# Patient Record
Sex: Female | Born: 1965 | Race: White | Hispanic: No | Marital: Married | Smoking: Former smoker
Health system: Southern US, Community
[De-identification: ages and names within clinical notes are randomized; demographics above are authoritative.]

## PROBLEM LIST (undated history)

## (undated) DIAGNOSIS — R0683 Snoring: Secondary | ICD-10-CM

## (undated) DIAGNOSIS — G4730 Sleep apnea, unspecified: Secondary | ICD-10-CM

## (undated) DIAGNOSIS — K219 Gastro-esophageal reflux disease without esophagitis: Secondary | ICD-10-CM

## (undated) DIAGNOSIS — F32A Depression, unspecified: Secondary | ICD-10-CM

## (undated) DIAGNOSIS — G2581 Restless legs syndrome: Secondary | ICD-10-CM

## (undated) DIAGNOSIS — E785 Hyperlipidemia, unspecified: Secondary | ICD-10-CM

## (undated) DIAGNOSIS — F329 Major depressive disorder, single episode, unspecified: Secondary | ICD-10-CM

## (undated) DIAGNOSIS — F419 Anxiety disorder, unspecified: Secondary | ICD-10-CM

## (undated) HISTORY — PX: CARPAL TUNNEL RELEASE: SHX101

---

## 2013-07-07 HISTORY — PX: ABDOMINAL HYSTERECTOMY: SHX81

## 2013-11-25 ENCOUNTER — Other Ambulatory Visit (HOSPITAL_COMMUNITY): Payer: Self-pay | Admitting: Specialist

## 2013-11-25 DIAGNOSIS — M25562 Pain in left knee: Secondary | ICD-10-CM

## 2013-12-09 ENCOUNTER — Ambulatory Visit (HOSPITAL_COMMUNITY): Payer: 59

## 2014-01-31 ENCOUNTER — Other Ambulatory Visit: Payer: Self-pay | Admitting: Orthopedic Surgery

## 2014-02-11 ENCOUNTER — Encounter (HOSPITAL_BASED_OUTPATIENT_CLINIC_OR_DEPARTMENT_OTHER): Payer: Self-pay | Admitting: *Deleted

## 2014-02-11 NOTE — Progress Notes (Signed)
Pt just moved from [place]-may not have anyone with her post op-to call surgeon about staying overnight Denies any cardiac /resp problems Does snore-has been mentioned about a sleep study

## 2014-02-14 ENCOUNTER — Encounter (HOSPITAL_BASED_OUTPATIENT_CLINIC_OR_DEPARTMENT_OTHER): Payer: Self-pay | Admitting: *Deleted

## 2014-02-14 ENCOUNTER — Ambulatory Visit (HOSPITAL_BASED_OUTPATIENT_CLINIC_OR_DEPARTMENT_OTHER): Payer: 59 | Admitting: Anesthesiology

## 2014-02-14 ENCOUNTER — Ambulatory Visit (HOSPITAL_BASED_OUTPATIENT_CLINIC_OR_DEPARTMENT_OTHER)
Admission: RE | Admit: 2014-02-14 | Discharge: 2014-02-14 | Disposition: A | Discharge status: Home / Self Care | Payer: 59 | Source: Ambulatory Visit | Attending: Orthopedic Surgery | Admitting: Orthopedic Surgery

## 2014-02-14 ENCOUNTER — Encounter (HOSPITAL_BASED_OUTPATIENT_CLINIC_OR_DEPARTMENT_OTHER)
Admission: RE | Disposition: A | Discharge status: Home / Self Care | Payer: Self-pay | Source: Ambulatory Visit | Attending: Orthopedic Surgery

## 2014-02-14 DIAGNOSIS — Z87891 Personal history of nicotine dependence: Secondary | ICD-10-CM | POA: Diagnosis not present

## 2014-02-14 DIAGNOSIS — F329 Major depressive disorder, single episode, unspecified: Secondary | ICD-10-CM | POA: Insufficient documentation

## 2014-02-14 DIAGNOSIS — E785 Hyperlipidemia, unspecified: Secondary | ICD-10-CM | POA: Insufficient documentation

## 2014-02-14 DIAGNOSIS — G2581 Restless legs syndrome: Secondary | ICD-10-CM | POA: Diagnosis not present

## 2014-02-14 DIAGNOSIS — R0683 Snoring: Secondary | ICD-10-CM | POA: Insufficient documentation

## 2014-02-14 DIAGNOSIS — F419 Anxiety disorder, unspecified: Secondary | ICD-10-CM | POA: Diagnosis not present

## 2014-02-14 DIAGNOSIS — M7531 Calcific tendinitis of right shoulder: Secondary | ICD-10-CM | POA: Diagnosis present

## 2014-02-14 HISTORY — DX: Snoring: R06.83

## 2014-02-14 HISTORY — DX: Anxiety disorder, unspecified: F41.9

## 2014-02-14 HISTORY — PX: SHOULDER ARTHROSCOPY: SHX128

## 2014-02-14 HISTORY — DX: Hyperlipidemia, unspecified: E78.5

## 2014-02-14 HISTORY — DX: Major depressive disorder, single episode, unspecified: F32.9

## 2014-02-14 HISTORY — DX: Restless legs syndrome: G25.81

## 2014-02-14 HISTORY — DX: Depression, unspecified: F32.A

## 2014-02-14 LAB — POCT HEMOGLOBIN-HEMACUE: Hemoglobin: 12.9 g/dL (ref 12.0–15.0)

## 2014-02-14 SURGERY — ARTHROSCOPY, SHOULDER
Anesthesia: Regional | Site: Shoulder | Laterality: Right

## 2014-02-14 MED ORDER — SODIUM CHLORIDE 0.9 % IR SOLN
Status: DC | PRN
  Administered 2014-02-14: 3000 mL

## 2014-02-14 MED ORDER — PROMETHAZINE HCL 25 MG/ML IJ SOLN
6.2500 mg | Freq: Once | INTRAMUSCULAR | Status: AC
  Administered 2014-02-14: 6.25 mg via INTRAVENOUS

## 2014-02-14 MED ORDER — FENTANYL CITRATE 0.05 MG/ML IJ SOLN
INTRAMUSCULAR | Status: DC | PRN
  Administered 2014-02-14: 100 ug via INTRAVENOUS

## 2014-02-14 MED ORDER — OXYCODONE HCL 5 MG/5ML PO SOLN: 5.0000 mg | Freq: Once | ORAL | Status: DC | PRN

## 2014-02-14 MED ORDER — SUCCINYLCHOLINE CHLORIDE 20 MG/ML IJ SOLN
INTRAMUSCULAR | Status: DC | PRN
  Administered 2014-02-14: 100 mg via INTRAVENOUS

## 2014-02-14 MED ORDER — DOCUSATE SODIUM 100 MG PO CAPS: 100.0000 mg | ORAL_CAPSULE | Freq: Three times a day (TID) | ORAL | Status: DC | PRN

## 2014-02-14 MED ORDER — FENTANYL CITRATE 0.05 MG/ML IJ SOLN
INTRAMUSCULAR | Status: AC
  Filled 2014-02-14: qty 2

## 2014-02-14 MED ORDER — PROMETHAZINE HCL 25 MG/ML IJ SOLN
INTRAMUSCULAR | Status: AC
  Filled 2014-02-14: qty 1

## 2014-02-14 MED ORDER — FENTANYL CITRATE 0.05 MG/ML IJ SOLN
50.0000 ug | INTRAMUSCULAR | Status: DC | PRN
  Administered 2014-02-14: 100 ug via INTRAVENOUS

## 2014-02-14 MED ORDER — POVIDONE-IODINE 7.5 % EX SOLN: Freq: Once | CUTANEOUS | Status: DC

## 2014-02-14 MED ORDER — CEFAZOLIN SODIUM-DEXTROSE 2-3 GM-% IV SOLR
INTRAVENOUS | Status: AC
  Filled 2014-02-14: qty 50

## 2014-02-14 MED ORDER — OXYCODONE-ACETAMINOPHEN 5-325 MG PO TABS: 1.0000 | ORAL_TABLET | ORAL | Status: DC | PRN

## 2014-02-14 MED ORDER — CEFAZOLIN SODIUM-DEXTROSE 2-3 GM-% IV SOLR
2.0000 g | INTRAVENOUS | Status: AC
  Administered 2014-02-14: 2 g via INTRAVENOUS

## 2014-02-14 MED ORDER — OXYCODONE HCL 5 MG PO TABS: 5.0000 mg | ORAL_TABLET | Freq: Once | ORAL | Status: DC | PRN

## 2014-02-14 MED ORDER — HYDROMORPHONE HCL 1 MG/ML IJ SOLN: 0.2500 mg | INTRAMUSCULAR | Status: DC | PRN

## 2014-02-14 MED ORDER — LACTATED RINGERS IV SOLN
INTRAVENOUS | Status: DC
  Administered 2014-02-14 (×2): via INTRAVENOUS

## 2014-02-14 MED ORDER — PROPOFOL 10 MG/ML IV BOLUS
INTRAVENOUS | Status: DC | PRN
  Administered 2014-02-14: 200 mg via INTRAVENOUS
  Administered 2014-02-14: 50 mg via INTRAVENOUS

## 2014-02-14 MED ORDER — MIDAZOLAM HCL 2 MG/2ML IJ SOLN
1.0000 mg | INTRAMUSCULAR | Status: DC | PRN
  Administered 2014-02-14: 2 mg via INTRAVENOUS

## 2014-02-14 MED ORDER — MIDAZOLAM HCL 2 MG/2ML IJ SOLN
INTRAMUSCULAR | Status: AC
  Filled 2014-02-14: qty 2

## 2014-02-14 MED ORDER — DEXAMETHASONE SODIUM PHOSPHATE 4 MG/ML IJ SOLN
INTRAMUSCULAR | Status: DC | PRN
  Administered 2014-02-14: 10 mg via INTRAVENOUS

## 2014-02-14 MED ORDER — BUPIVACAINE HCL (PF) 0.5 % IJ SOLN
INTRAMUSCULAR | Status: AC
  Filled 2014-02-14: qty 30

## 2014-02-14 MED ORDER — EPHEDRINE SULFATE 50 MG/ML IJ SOLN
INTRAMUSCULAR | Status: DC | PRN
  Administered 2014-02-14: 10 mg via INTRAVENOUS

## 2014-02-14 MED ORDER — ROPIVACAINE HCL 5 MG/ML IJ SOLN
INTRAMUSCULAR | Status: DC | PRN
  Administered 2014-02-14: 25 mL via PERINEURAL

## 2014-02-14 MED ORDER — ONDANSETRON HCL 4 MG/2ML IJ SOLN
INTRAMUSCULAR | Status: DC | PRN
  Administered 2014-02-14: 4 mg via INTRAVENOUS

## 2014-02-14 SURGICAL SUPPLY — 76 items
BENZOIN TINCTURE PRP APPL 2/3 (GAUZE/BANDAGES/DRESSINGS) IMPLANT
BLADE CLIPPER SURG (BLADE) IMPLANT
BLADE SURG 15 STRL LF DISP TIS (BLADE) IMPLANT
BLADE SURG 15 STRL SS (BLADE)
BUR OVAL 4.0 (BURR) ×3 IMPLANT
CANISTER SUCT 3000ML (MISCELLANEOUS) IMPLANT
CANNULA 5.75X71 LONG (CANNULA) ×3 IMPLANT
CANNULA TWIST IN 8.25X7CM (CANNULA) IMPLANT
CHLORAPREP W/TINT 26ML (MISCELLANEOUS) ×3 IMPLANT
DECANTER SPIKE VIAL GLASS SM (MISCELLANEOUS) IMPLANT
DRAPE INCISE IOBAN 66X45 STRL (DRAPES) ×3 IMPLANT
DRAPE STERI 35X30 U-POUCH (DRAPES) ×3 IMPLANT
DRAPE SURG 17X23 STRL (DRAPES) ×3 IMPLANT
DRAPE U 20/CS (DRAPES) ×3 IMPLANT
DRAPE U-SHAPE 47X51 STRL (DRAPES) ×3 IMPLANT
DRAPE U-SHAPE 76X120 STRL (DRAPES) ×6 IMPLANT
DRSG PAD ABDOMINAL 8X10 ST (GAUZE/BANDAGES/DRESSINGS) ×3 IMPLANT
ELECT REM PT RETURN 9FT ADLT (ELECTROSURGICAL) ×3
ELECTRODE REM PT RTRN 9FT ADLT (ELECTROSURGICAL) ×2 IMPLANT
GAUZE SPONGE 4X4 12PLY STRL (GAUZE/BANDAGES/DRESSINGS) ×3 IMPLANT
GAUZE SPONGE 4X4 16PLY XRAY LF (GAUZE/BANDAGES/DRESSINGS) IMPLANT
GAUZE XEROFORM 1X8 LF (GAUZE/BANDAGES/DRESSINGS) ×3 IMPLANT
GLOVE BIO SURGEON STRL SZ7 (GLOVE) ×3 IMPLANT
GLOVE BIO SURGEON STRL SZ7.5 (GLOVE) ×3 IMPLANT
GLOVE BIOGEL M 7.0 STRL (GLOVE) ×3 IMPLANT
GLOVE BIOGEL PI IND STRL 7.0 (GLOVE) ×2 IMPLANT
GLOVE BIOGEL PI IND STRL 8 (GLOVE) ×2 IMPLANT
GLOVE BIOGEL PI INDICATOR 7.0 (GLOVE) ×1
GLOVE BIOGEL PI INDICATOR 8 (GLOVE) ×1
GOWN STRL REUS W/ TWL LRG LVL3 (GOWN DISPOSABLE) ×2 IMPLANT
GOWN STRL REUS W/TWL LRG LVL3 (GOWN DISPOSABLE) ×1
GOWN STRL REUS W/TWL XL LVL3 (GOWN DISPOSABLE) ×6 IMPLANT
GOWN STRL REUS W/TWL XL LVL4 (GOWN DISPOSABLE) ×3 IMPLANT
LIQUID BAND (GAUZE/BANDAGES/DRESSINGS) IMPLANT
MANIFOLD NEPTUNE II (INSTRUMENTS) ×3 IMPLANT
NDL SUT 6 .5 CRC .975X.05 MAYO (NEEDLE) IMPLANT
NEEDLE 1/2 CIR CATGUT .05X1.09 (NEEDLE) IMPLANT
NEEDLE MAYO TAPER (NEEDLE)
NEEDLE SCORPION MULTI FIRE (NEEDLE) IMPLANT
NS IRRIG 1000ML POUR BTL (IV SOLUTION) IMPLANT
PACK ARTHROSCOPY DSU (CUSTOM PROCEDURE TRAY) ×3 IMPLANT
PACK BASIN DAY SURGERY FS (CUSTOM PROCEDURE TRAY) ×3 IMPLANT
PENCIL BUTTON HOLSTER BLD 10FT (ELECTRODE) IMPLANT
RESECTOR FULL RADIUS 4.2MM (BLADE) ×3 IMPLANT
SLEEVE SCD COMPRESS KNEE MED (MISCELLANEOUS) ×3 IMPLANT
SLING ARM IMMOBILIZER MED (SOFTGOODS) IMPLANT
SLING ARM LRG ADULT FOAM STRAP (SOFTGOODS) ×3 IMPLANT
SLING ARM MED ADULT FOAM STRAP (SOFTGOODS) IMPLANT
SLING ARM XL FOAM STRAP (SOFTGOODS) IMPLANT
SPONGE LAP 4X18 X RAY DECT (DISPOSABLE) IMPLANT
STRIP CLOSURE SKIN 1/2X4 (GAUZE/BANDAGES/DRESSINGS) IMPLANT
SUCTION FRAZIER TIP 10 FR DISP (SUCTIONS) IMPLANT
SUPPORT WRAP ARM LG (MISCELLANEOUS) IMPLANT
SUT 2 FIBERLOOP 20 STRT BLUE (SUTURE)
SUT BONE WAX W31G (SUTURE) IMPLANT
SUT ETHILON 3 0 PS 1 (SUTURE) ×3 IMPLANT
SUT FIBERWIRE #2 38 T-5 BLUE (SUTURE)
SUT MNCRL AB 3-0 PS2 18 (SUTURE) IMPLANT
SUT MNCRL AB 4-0 PS2 18 (SUTURE) IMPLANT
SUT PDS AB 0 CT 36 (SUTURE) IMPLANT
SUT PROLENE 3 0 PS 2 (SUTURE) IMPLANT
SUT TIGER TAPE 7 IN WHITE (SUTURE) IMPLANT
SUT VIC AB 0 CT1 27 (SUTURE)
SUT VIC AB 0 CT1 27XBRD ANBCTR (SUTURE) IMPLANT
SUT VIC AB 2-0 SH 27 (SUTURE)
SUT VIC AB 2-0 SH 27XBRD (SUTURE) IMPLANT
SUTURE 2 FIBERLOOP 20 STRT BLU (SUTURE) IMPLANT
SUTURE FIBERWR #2 38 T-5 BLUE (SUTURE) IMPLANT
SYR BULB 3OZ (MISCELLANEOUS) IMPLANT
TOWEL OR 17X24 6PK STRL BLUE (TOWEL DISPOSABLE) ×3 IMPLANT
TOWEL OR NON WOVEN STRL DISP B (DISPOSABLE) ×3 IMPLANT
TUBE CONNECTING 20X1/4 (TUBING) ×6 IMPLANT
TUBING ARTHROSCOPY IRRIG 16FT (MISCELLANEOUS) ×3 IMPLANT
WAND STAR VAC 90 (SURGICAL WAND) ×3 IMPLANT
WATER STERILE IRR 1000ML POUR (IV SOLUTION) ×3 IMPLANT
YANKAUER SUCT BULB TIP NO VENT (SUCTIONS) IMPLANT

## 2014-02-14 NOTE — H&P (Signed)
Sherri Perry is an 48 y.o. female.   Chief Complaint: R shoulder pain HPI: R shoulder calcific tendonitis, failed conservative treatment.  Past Medical History  Diagnosis Date  . Depression   . Anxiety   . Hyperlipemia   . RLS (restless legs syndrome)   . Snores     Past Surgical History  Procedure Laterality Date  . Abdominal hysterectomy  4/15  . Carpal tunnel release      right and left    History reviewed. No pertinent family history. Social History:  reports that she quit smoking about 20 years ago. She does not have any smokeless tobacco history on file. She reports that she drinks alcohol. She reports that she does not use illicit drugs.  Allergies: No Known Allergies  Medications Prior to Admission  Medication Sig Dispense Refill  . atorvastatin (LIPITOR) 20 MG tablet Take 20 mg by mouth daily.    . Iron-Vit C-Vit B12-Folic Acid (IRON 007 PLUS PO) Take by mouth.    . Multiple Vitamins-Minerals (MULTIVITAMIN WITH MINERALS) tablet Take 1 tablet by mouth daily.    . pramipexole (MIRAPEX) 0.125 MG tablet Take 0.125 mg by mouth 3 (three) times daily.    Marland Kitchen rOPINIRole (REQUIP) 2 MG tablet Take 2 mg by mouth at bedtime.    . sertraline (ZOLOFT) 100 MG tablet Take 200 mg by mouth daily.      Results for orders placed or performed during the hospital encounter of 02/14/14 (from the past 48 hour(s))  Hemoglobin-hemacue, POC     Status: None   Collection Time: 02/14/14 12:00 PM  Result Value Ref Range   Hemoglobin 12.9 12.0 - 15.0 g/dL   No results found.  Review of Systems  All other systems reviewed and are negative.   Blood pressure 117/65, pulse 74, temperature 98.5 F (36.9 C), temperature source Oral, resp. rate 20, height 5\' 2"  (1.575 m), weight 87.091 kg (192 lb), SpO2 95 %. Physical Exam  Constitutional: She is oriented to person, place, and time. She appears well-developed and well-nourished.  HENT:  Head: Atraumatic.  Eyes: EOM are normal.   Cardiovascular: Intact distal pulses.   Respiratory: Effort normal.  Musculoskeletal:  R shoulder pain with RC / impingement testing, NVI  Neurological: She is alert and oriented to person, place, and time.  Skin: Skin is warm and dry.  Psychiatric: She has a normal mood and affect.     Assessment/Plan R shoulder calcific tendonitis Plan R arth debridement, possible RCR, SAD Risks / benefits of surgery discussed Consent on chart  NPO for OR Preop antibiotics   Deijah Spikes WILLIAM 02/14/2014, 12:48 PM

## 2014-02-14 NOTE — Op Note (Signed)
Procedure(s):  SANDRALEE TARKINGTON female 48 y.o. 02/14/2014  Procedure(s) and Anesthesia Type:   Right shoulder arthroscopic extensive debridement calcific tendinitis  Surgeon(s) and Role:    * Nita Sells, MD - Primary     Surgeon: Nita Sells   Assistants: Jeanmarie Hubert PA-C [facility] was present and scrubbed throughout the procedure and was essential in positioning, assisting with the camera and instrumentation,, and closure)  Anesthesia: General endotracheal anesthesia with preoperative interscalene block    Procedure Detail   Estimated Blood Loss: Min         Drains: none  Blood Given: none         Specimens: none        Complications:  * No complications entered in OR log *         Disposition: PACU - hemodynamically stable.         Condition: stable    Procedure:   INDICATIONS FOR SURGERY: The patient is 48 y.o. female who has had a long history of right shoulder pain with calcific tendinitis which hasfailed conservative treatment.  OPERATIVE FINDINGS: Examination under anesthesia: no stiffness or instability Diagnostic Arthroscopy:  Glenoid articular cartilage:intact Humeral head articular cartilage: intact Labrum: intact Loose bodies: none Synovitis: none Articular sided rotator cuff: intact Bursal sided rotator cuff: intact, large calcific deposit, completely excised through a small longitudinal split in the supraspinatus. Coracoacromial ligament: intact, no fraying  DESCRIPTION OF PROCEDURE: The patient was identified in preoperative  holding area where I personally marked the operative site after  verifying site, side, and procedure with the patient. An interscalene block was given by the attending anesthesiologist the holding area.  The patient was taken back to the operating room where general anesthesia was induced without complication and was placed in the beach-chair position with the back  elevated about 60  degrees and all extremities and head and neck carefully padded and  positioned.   The right upper extremity was then prepped and  draped in a standard sterile fashion. The appropriate time-out  procedure was carried out. The patient did receive IV antibiotics  within 30 minutes of incision.   A small posterior portal incision was made and the arthroscope was introduced into the joint. An anterior portal was then established above the subscapularis using needle localization. Small cannula was placed anteriorly. Diagnostic arthroscopy was then carried out with findings as described above.  Glenohumeral joint looked very good. Underside of the rotator cuff was completely intact.  The arthroscope was then introduced into the subacromial space a standard lateral portal was established with needle localization. The shaver was used through the lateral portal to perform extensive bursectomy. Coracoacromial ligament was examined and found to be intact without significant fraying.Marland Kitchen  She was noted to have a significant amount of bursitis. After the extensive bursectomy the bursal surface the rotator cuff was carefully examined. There is no partial thickness tearing. There was a slight prominent centrally where I could tell there was some calcium beneath the ear. I used a spinal needle to first probed and once I found the area of calcium I used an 11 blade to split the tendon longitudinally about 5 mm. A probe was then used to release the calcium. A large amount calcium was removed first with appropriate and subsequently with the shaver was suctioned. There is no exposed tuberosity. A longitudinal split in the tendon stayed quite small and was not felt necessary for any formal closure.  The arthroscopic equipment was removed from  the joint and the portals were closed with 3-0 nylon in an interrupted fashion. Sterile dressings were then applied including Xeroform 4 x 4's ABDs and tape. The patient was then  allowed to awaken from general anesthesia, placed in a sling, transferred to the stretcher and taken to the recovery room in stable condition.   POSTOPERATIVE PLAN: The patient will be discharged home today and will followup in one week for suture removal and wound check.

## 2014-02-14 NOTE — Discharge Instructions (Signed)
Discharge Instructions after Arthroscopic Shoulder Surgery ° ° °A sling has been provided for you. You may remove the sling after 72 hours. The sling may be worn for your protection, if you are in a crowd.  °Use ice on the shoulder intermittently over the first 48 hours after surgery.  °Pain medication has been prescribed for you.  °Use your medication liberally over the first 48 hours, and then begin to taper your use. You may take Extra Strength Tylenol or Tylenol only in place of the pain pills. DO NOT take ANY nonsteroidal anti-inflammatory pain medications: Advil, Motrin, Ibuprofen, Aleve, Naproxen, or Naprosyn.  °You may remove your dressing after two days.  °You may shower 5 days after surgery. The incision CANNOT get wet prior to 5 days. Simply allow the water to wash over the site and then pat dry. Do not rub the incision. Make sure your axilla (armpit) is completely dry after showering.  °Take one aspirin a day for 2 weeks after surgery, unless you have an aspirin sensitivity/allergy or asthma.  °Three to 5 times each day you should perform assisted overhead reaching and external rotation (outward turning) exercises with the operative arm. Both exercises should be done with the non-operative arm used as the "therapist arm" while the operative arm remains relaxed. Ten of each exercise should be done three to five times each day. ° ° ° °Overhead reach is helping to lift your stiff arm up as high as it will go. To stretch your overhead reach, lie flat on your back, relax, and grasp the wrist of the tight shoulder with your opposite hand. Using the power in your opposite arm, bring the stiff arm up as far as it is comfortable. Start holding it for ten seconds and then work up to where you can hold it for a count of 30. Breathe slowly and deeply while the arm is moved. Repeat this stretch ten times, trying to help the arm up a little higher each time.  ° ° ° ° ° °External rotation is turning the arm out to  the side while your elbow stays close to your body. External rotation is best stretched while you are lying on your back. Hold a cane, yardstick, broom handle, or dowel in both hands. Bend both elbows to a right angle. Use steady, gentle force from your normal arm to rotate the hand of the stiff shoulder out away from your body. Continue the rotation as far as it will go comfortably, holding it there for a count of 10. Repeat this exercise ten times.  ° ° ° °Please call [phone] during normal business hours or [phone] after hours for any problems. Including the following: ° °- excessive redness of the incisions °- drainage for more than 4 days °- fever of more than 101.5 F ° °*Please note that pain medications will not be refilled after hours or on weekends. ° ° ° °Post Anesthesia Home Care Instructions ° °Activity: °Get plenty of rest for the remainder of the day. A responsible adult should stay with you for 24 hours following the procedure.  °For the next 24 hours, DO NOT: °-Drive a car °-Operate machinery °-Drink alcoholic beverages °-Take any medication unless instructed by your physician °-Make any legal decisions or sign important papers. ° °Meals: °Start with liquid foods such as gelatin or soup. Progress to regular foods as tolerated. Avoid greasy, spicy, heavy foods. If nausea and/or vomiting occur, drink only clear liquids until the nausea and/or vomiting subsides. Call   your physician if vomiting continues. ° °Special Instructions/Symptoms: °Your throat may feel dry or sore from the anesthesia or the breathing tube placed in your throat during surgery. If this causes discomfort, gargle with warm salt water. The discomfort should disappear within 24 hours. ° ° °Regional Anesthesia Blocks ° °1. Numbness or the inability to move the "blocked" extremity may last from 3-48 hours after placement. The length of time depends on the medication injected and your individual response to the medication. If the  numbness is not going away after 48 hours, call your surgeon. ° °2. The extremity that is blocked will need to be protected until the numbness is gone and the  Strength has returned. Because you cannot feel it, you will need to take extra care to avoid injury. Because it may be weak, you may have difficulty moving it or using it. You may not know what position it is in without looking at it while the block is in effect. ° °3. For blocks in the legs and feet, returning to weight bearing and walking needs to be done carefully. You will need to wait until the numbness is entirely gone and the strength has returned. You should be able to move your leg and foot normally before you try and bear weight or walk. You will need someone to be with you when you first try to ensure you do not fall and possibly risk injury. ° °4. Bruising and tenderness at the needle site are common side effects and will resolve in a few days. ° °5. Persistent numbness or new problems with movement should be communicated to the surgeon or the [facility] ([phone])/ [facility] ([phone]). °

## 2014-02-14 NOTE — Transfer of Care (Signed)
Immediate Anesthesia Transfer of Care Note  Patient: Sherri Perry  Procedure(s) Performed: Procedure(s) with comments: ARTHROSCOPY SHOULDER (Right) - Right shoulder arthroscopy debridement calcific tendonitis, subacromial decompression,   Patient Location: PACU  Anesthesia Type:GA combined with regional for post-op pain  Level of Consciousness: awake, sedated and patient cooperative  Airway & Oxygen Therapy: Patient Spontanous Breathing and Patient connected to face mask oxygen  Post-op Assessment: Report given to PACU RN and Post -op Vital signs reviewed and stable  Post vital signs: Reviewed and stable  Complications: No apparent anesthesia complications

## 2014-02-14 NOTE — Anesthesia Procedure Notes (Addendum)
Anesthesia Regional Block:  Interscalene brachial plexus block  Pre-Anesthetic Checklist: ,, timeout performed, Correct Patient, Correct Site, Correct Laterality, Correct Procedure, Correct Position, site marked, Risks and benefits discussed, pre-op evaluation,  At surgeon's request and post-op pain management  Laterality: Right  Prep: Maximum Sterile Barrier Precautions used and chloraprep       Needles:  Injection technique: Single-shot  Needle Type: Echogenic Stimulator Needle     Needle Length: 5cm 5 cm Needle Gauge: 22 and 22 G    Additional Needles:  Procedures: ultrasound guided (picture in chart) and nerve stimulator Interscalene brachial plexus block  Nerve Stimulator or Paresthesia:  Response: Biceps response,   Additional Responses:   Narrative:  Start time: 02/14/2014 12:43 PM End time: 02/14/2014 12:52 PM Injection made incrementally with aspirations every 5 mL.  Additional Notes: 2% Lidocaine skin wheel.    Procedure Name: Intubation Date/Time: 02/14/2014 1:17 PM Performed by: Lyndee Leo Pre-anesthesia Checklist: Patient identified, Emergency Drugs available, Suction available and Patient being monitored Patient Re-evaluated:Patient Re-evaluated prior to inductionOxygen Delivery Method: Circle System Utilized Preoxygenation: Pre-oxygenation with 100% oxygen Intubation Type: IV induction Ventilation: Mask ventilation without difficulty Laryngoscope Size: Miller and 2 Grade View: Grade IV Tube type: Oral Tube size: 7.0 mm Number of attempts: 3 Airway Equipment and Method: stylet,  oral airway,  Patient positioned with wedge pillow and Video-laryngoscopy Placement Confirmation: ETT inserted through vocal cords under direct vision,  positive ETCO2 and breath sounds checked- equal and bilateral Secured at: 21 cm Tube secured with: Tape Dental Injury: Teeth and Oropharynx as per pre-operative assessment  Difficulty Due To: Difficulty was anticipated,  Difficult Airway- due to large tongue and Difficult Airway- due to limited oral opening Future Recommendations: Recommend- induction with short-acting agent, and alternative techniques readily available Comments: Unable to visualize vocal cords with Sabra Heck #2 x2. Epiglotis only. Glidescope x1. Ebbs checked by Dr. Ola Spurr.

## 2014-02-14 NOTE — Anesthesia Preprocedure Evaluation (Signed)
Anesthesia Evaluation  Patient identified by MRN, date of birth, ID band Patient awake    Reviewed: Allergy & Precautions, H&P , NPO status , Patient's Chart, lab work & pertinent test results  Airway Mallampati: II  TM Distance: >3 FB Neck ROM: Full    Dental no notable dental hx. (+) Teeth Intact, Dental Advisory Given   Pulmonary neg pulmonary ROS, former smoker,  breath sounds clear to auscultation  Pulmonary exam normal       Cardiovascular negative cardio ROS  Rhythm:Regular Rate:Normal     Neuro/Psych Anxiety Depression negative neurological ROS     GI/Hepatic negative GI ROS, Neg liver ROS,   Endo/Other  negative endocrine ROS  Renal/GU negative Renal ROS  negative genitourinary   Musculoskeletal   Abdominal   Peds  Hematology negative hematology ROS (+)   Anesthesia Other Findings   Reproductive/Obstetrics negative OB ROS                             Anesthesia Physical Anesthesia Plan  ASA: II  Anesthesia Plan: General and Regional   Post-op Pain Management:    Induction: Intravenous  Airway Management Planned: Oral ETT  Additional Equipment:   Intra-op Plan:   Post-operative Plan: Extubation in OR  Informed Consent: I have reviewed the patients History and Physical, chart, labs and discussed the procedure including the risks, benefits and alternatives for the proposed anesthesia with the patient or authorized representative who has indicated his/her understanding and acceptance.   Dental advisory given  Plan Discussed with: CRNA  Anesthesia Plan Comments:         Anesthesia Quick Evaluation

## 2014-02-14 NOTE — Progress Notes (Signed)
Assisted Dr. Edmond Fitzgerald with right, ultrasound guided, interscalene  block. Side rails up, monitors on throughout procedure. See vital signs in flow sheet. Tolerated Procedure well. 

## 2014-02-14 NOTE — Anesthesia Postprocedure Evaluation (Signed)
  Anesthesia Post-op Note  Patient: Sherri Perry  Procedure(s) Performed: Procedure(s) with comments: ARTHROSCOPY SHOULDER (Right) - Right shoulder arthroscopy debridement calcific tendonitis, subacromial decompression,   Patient Location: PACU  Anesthesia Type:General and block  Level of Consciousness: awake and alert   Airway and Oxygen Therapy: Patient Spontanous Breathing  Post-op Pain: none  Post-op Assessment: Post-op Vital signs reviewed, Patient's Cardiovascular Status Stable and Respiratory Function Stable  Post-op Vital Signs: Reviewed  Filed Vitals:   02/14/14 1445  BP:   Pulse: 95  Temp:   Resp: 19    Complications: No apparent anesthesia complications

## 2014-02-15 ENCOUNTER — Encounter (HOSPITAL_BASED_OUTPATIENT_CLINIC_OR_DEPARTMENT_OTHER): Payer: Self-pay | Admitting: Orthopedic Surgery

## 2014-02-25 ENCOUNTER — Ambulatory Visit (HOSPITAL_COMMUNITY)
Admission: RE | Admit: 2014-02-25 | Discharge: 2014-02-25 | Disposition: A | Discharge status: Home / Self Care | Payer: 59 | Source: Ambulatory Visit | Attending: Specialist | Admitting: Specialist

## 2014-02-25 DIAGNOSIS — M25562 Pain in left knee: Secondary | ICD-10-CM

## 2014-03-11 ENCOUNTER — Ambulatory Visit: Payer: 59 | Attending: Orthopedic Surgery | Admitting: Physical Therapy

## 2014-03-11 ENCOUNTER — Encounter: Payer: Self-pay | Admitting: Physical Therapy

## 2014-03-11 DIAGNOSIS — Z9889 Other specified postprocedural states: Secondary | ICD-10-CM | POA: Diagnosis present

## 2014-03-11 NOTE — Therapy (Signed)
[facility] [address] [place], [place], [address] Phone: [phone]   Fax:  [phone]  Physical Therapy Evaluation  Patient Details  Name: Sherri Perry MRN: [id number] Date of Birth: 01/02/1966  Encounter Date: 03/11/2014      PT End of Session - 03/11/14 1000    Visit Number 1   Number of Visits 12   Date for PT Re-Evaluation 04/22/14   PT Start Time 0813   PT Stop Time 0905   PT Time Calculation (min) 52 min   Activity Tolerance Patient tolerated treatment well      Past Medical History  Diagnosis Date  . Depression   . Anxiety   . Hyperlipemia   . RLS (restless legs syndrome)   . Snores     Past Surgical History  Procedure Laterality Date  . Abdominal hysterectomy  4/15  . Carpal tunnel release      right and left  . Shoulder arthroscopy Right 02/14/2014    Procedure: ARTHROSCOPY SHOULDER;  Surgeon: Nita Sells, MD;  Location: [place];  Service: Orthopedics;  Laterality: Right;  Right shoulder arthroscopy debridement calcific tendonitis, subacromial decompression,     There were no vitals taken for this visit.  Visit Diagnosis:  History of arthroscopy of right shoulder - Plan: PT plan of care cert/re-cert      Subjective Assessment - 03/11/14 0941    Symptoms Patient reports several years of right shoulder pain (with no known injury) and underwent right shoulder debridement of calcific tendonitis, extensive bursectomy, SAD with no formal closure of longitudinal split of supraspinatus on 02/14/14.  She  states she only wore the sling for a few days and has returned to her work in [place] after only 2 days.  She states she is still in a lot of pain but is not taking any pain medication.     Pertinent History Right hand dominant;  currently having left knee problems and recently had MRI   How long can you sit comfortably? no impairment   How long can you stand comfortably? no impairment    How long can you walk comfortably? no impairment   Patient Stated Goals return to yoga   Currently in Pain? Yes   Pain Score 5    Pain Location Shoulder   Pain Orientation Right   Pain Descriptors / Indicators Sore   Pain Type Surgical pain   Pain Onset More than a month ago   Pain Frequency Intermittent   Aggravating Factors  extending, abduction, flexion   Pain Relieving Factors rest with propped slightly forward   Multiple Pain Sites No          [facility] PT Assessment - 03/11/14 0949    Assessment   Medical Diagnosis s/p right shoulder arthroscopy   Onset Date --  > 2years;  surgery 02/14/14   Next MD Visit December ?   Prior Therapy No   Precautions   Precautions None   Restrictions   Weight Bearing Restrictions No   Balance Screen   Has the patient fallen in the past 6 months No   Has the patient had a decrease in activity level because of a fear of falling?  No   Is the patient reluctant to leave their home because of a fear of falling?  No   Home Environment   Living Enviornment Private residence   Prior Function   Level of [place] Independent with basic ADLs   Leisure --  yoga  Observation/Other Assessments   Focus on Therapeutic Outcomes (FOTO)  --  not captured   Posture/Postural Control   Posture/Postural Control No significant limitations  Swelling noted lateral upper right arm; portals appear heale   AROM   Right Shoulder Extension 0 Degrees   Right Shoulder Flexion 153 Degrees   Right Shoulder ABduction 118 Degrees   Right Shoulder Internal Rotation --  behind back to T10   Right Shoulder External Rotation 74 Degrees   Left Shoulder Extension 30 Degrees   Left Shoulder Flexion 157 Degrees   Left Shoulder ABduction 154 Degrees   Left Shoulder Internal Rotation --  behind back to T10   Left Shoulder External Rotation 74 Degrees   Strength   Right Shoulder Flexion 3-/5   Right Shoulder Extension 3-/5   Right Shoulder Internal Rotation 3-/5    Left Shoulder Extension 5/5   Left Shoulder ABduction 5/5   Left Shoulder External Rotation 5/5            PT Education - 03/11/14 0959    Education provided Yes   Education Details shoulder isometrics   Person(s) Educated Patient   Methods Explanation;Demonstration;Handout   Comprehension Verbalized understanding;Returned demonstration          PT Short Term Goals - 03/11/14 1008    PT SHORT TERM GOAL #1   Title Right shoulder flexion improved to 156 degrees needed to reach kitchen shelves.     Time 3   Period Weeks   Status New   PT SHORT TERM GOAL #2   Title Demonstrate and verbalize understanding of condition management including RICE, positioning, HEP.    Time 3   Period Weeks   Status New   PT SHORT TERM GOAL #3   Title "Report pain decrease from    5/10 to    3/10 with usual daily home and work ADLs.   Time 3   Period Weeks   Status New          PT Long Term Goals - 03/11/14 1012    PT LONG TERM GOAL #1   Title "Pt will be independent with advanced HEP for further gains in strength needed for higher level of physical activity.   Time 6   Period Weeks   Status New   PT LONG TERM GOAL #2   Title "R shoulder AROM scaption will improve to 0-158 degrees for improved overhead reaching   Time 6   Period Weeks   Status New   PT LONG TERM GOAL #3   Title "Pain will decrease to 1-2/10 with usual daily functional activities at work and home.   Time 6   Period Weeks   Status New          Plan - 03/11/14 1001    Clinical Impression Statement s/p right shoulder scope on 02/14/14 with extensive debridement for calcific tendonitis and SAD.  She reports a moderate amount of pain but is no longer taking pain medicine.  Her right shoulder AROM is limited: extension 0 and painful flexion 153, abd 118 and painful, ER 74, IR behind back to T10.  Strength grossly 3-/5.  Mild swelling right upper arm.  No redness.  Portals appear well-healed.  She is back to work in  [place] and hopes to return to yoga.      Pt will benefit from skilled therapeutic intervention in order to improve on the following deficits Decreased range of motion;Impaired UE functional use;Pain;Decreased strength   Rehab Potential Good  PT Frequency 2x / week   PT Duration 6 weeks   PT Treatment/Interventions ADLs/Self Care Home Management;Cryotherapy;Moist Heat;Therapeutic exercise;Neuromuscular re-education;Patient/family education;Manual techniques   PT Next Visit Plan Do Quick DASH since FOTO not captured; Right shoulder progresive ROM toward end-range;  assess response to shoulder isometric HEP and progress to yellow band [place] as tolerated; shoulder and scapular joint mobs, cold pack   Consulted and Agree with Plan of Care Patient                              Problem List There are no active problems to display for this patient.   Ruben Im C 03/11/2014, 10:24 AM     Ruben Im, PT 03/11/2014 10:25 AM Phone: [phone] Fax: [phone]

## 2014-03-11 NOTE — Patient Instructions (Signed)
Instructed patient in shoulder isometrics and scapular retractions

## 2014-03-15 ENCOUNTER — Ambulatory Visit: Payer: 59 | Admitting: Physical Therapy

## 2014-03-17 ENCOUNTER — Telehealth: Payer: Self-pay | Admitting: Physical Therapy

## 2014-03-17 ENCOUNTER — Ambulatory Visit: Payer: 59 | Admitting: Physical Therapy

## 2014-03-17 NOTE — Telephone Encounter (Signed)
Pt was called on 03-15-14 and was left a message about missing appt and told she had an appt at 8:00 on 03-17-14.  When she did not come to 12-10 appt. Pt was called and phone number message said the phone mail box was no longer in use.

## 2014-03-22 ENCOUNTER — Ambulatory Visit: Payer: 59 | Admitting: Physical Therapy

## 2014-03-24 ENCOUNTER — Encounter: Payer: Self-pay | Admitting: Physical Therapy

## 2014-04-08 LAB — HM COLONOSCOPY

## 2014-05-11 ENCOUNTER — Encounter (HOSPITAL_BASED_OUTPATIENT_CLINIC_OR_DEPARTMENT_OTHER): Payer: Self-pay

## 2014-12-07 ENCOUNTER — Other Ambulatory Visit: Payer: Self-pay

## 2014-12-07 DIAGNOSIS — Z1231 Encounter for screening mammogram for malignant neoplasm of breast: Secondary | ICD-10-CM

## 2015-01-11 ENCOUNTER — Ambulatory Visit: Payer: Self-pay

## 2015-03-23 ENCOUNTER — Ambulatory Visit
Admission: RE | Admit: 2015-03-23 | Discharge: 2015-03-23 | Disposition: A | Discharge status: Home / Self Care | Payer: 59 | Source: Ambulatory Visit

## 2015-03-23 DIAGNOSIS — Z1231 Encounter for screening mammogram for malignant neoplasm of breast: Secondary | ICD-10-CM

## 2015-03-24 ENCOUNTER — Other Ambulatory Visit: Payer: Self-pay | Admitting: Family Medicine

## 2015-03-24 DIAGNOSIS — R2231 Localized swelling, mass and lump, right upper limb: Secondary | ICD-10-CM

## 2015-04-05 ENCOUNTER — Ambulatory Visit
Admission: RE | Admit: 2015-04-05 | Discharge: 2015-04-05 | Disposition: A | Discharge status: Home / Self Care | Payer: 59 | Source: Ambulatory Visit | Attending: Family Medicine | Admitting: Family Medicine

## 2015-04-05 DIAGNOSIS — R2231 Localized swelling, mass and lump, right upper limb: Secondary | ICD-10-CM

## 2015-04-12 DIAGNOSIS — L817 Pigmented purpuric dermatosis: Secondary | ICD-10-CM | POA: Diagnosis not present

## 2015-04-12 DIAGNOSIS — Z411 Encounter for cosmetic surgery: Secondary | ICD-10-CM | POA: Diagnosis not present

## 2015-05-10 DIAGNOSIS — M67912 Unspecified disorder of synovium and tendon, left shoulder: Secondary | ICD-10-CM | POA: Diagnosis not present

## 2015-05-17 ENCOUNTER — Other Ambulatory Visit: Payer: Self-pay | Admitting: Orthopedic Surgery

## 2015-05-22 MED FILL — ATORVASTATIN 10 MG TABLET: 10 | 30 days supply | Qty: 30 | Fill #1

## 2015-05-23 DIAGNOSIS — H6062 Unspecified chronic otitis externa, left ear: Secondary | ICD-10-CM | POA: Diagnosis not present

## 2015-05-23 DIAGNOSIS — H93293 Other abnormal auditory perceptions, bilateral: Secondary | ICD-10-CM | POA: Diagnosis not present

## 2015-05-23 MED FILL — FLUOCINOLONE OIL 0.01% EAR: 0.01 | 90 days supply | Qty: 20 | Fill #0

## 2015-05-24 ENCOUNTER — Encounter (HOSPITAL_BASED_OUTPATIENT_CLINIC_OR_DEPARTMENT_OTHER): Payer: Self-pay | Admitting: *Deleted

## 2015-05-29 ENCOUNTER — Ambulatory Visit (HOSPITAL_BASED_OUTPATIENT_CLINIC_OR_DEPARTMENT_OTHER): Payer: 59 | Admitting: Anesthesiology

## 2015-05-29 ENCOUNTER — Encounter (HOSPITAL_BASED_OUTPATIENT_CLINIC_OR_DEPARTMENT_OTHER)
Admission: RE | Disposition: A | Discharge status: Home / Self Care | Payer: Self-pay | Source: Ambulatory Visit | Attending: Orthopedic Surgery

## 2015-05-29 ENCOUNTER — Ambulatory Visit (HOSPITAL_BASED_OUTPATIENT_CLINIC_OR_DEPARTMENT_OTHER)
Admission: RE | Admit: 2015-05-29 | Discharge: 2015-05-29 | Disposition: A | Discharge status: Home / Self Care | Payer: 59 | Source: Ambulatory Visit | Attending: Orthopedic Surgery | Admitting: Orthopedic Surgery

## 2015-05-29 ENCOUNTER — Encounter (HOSPITAL_BASED_OUTPATIENT_CLINIC_OR_DEPARTMENT_OTHER): Payer: Self-pay | Admitting: Anesthesiology

## 2015-05-29 DIAGNOSIS — K219 Gastro-esophageal reflux disease without esophagitis: Secondary | ICD-10-CM | POA: Diagnosis not present

## 2015-05-29 DIAGNOSIS — F329 Major depressive disorder, single episode, unspecified: Secondary | ICD-10-CM | POA: Diagnosis not present

## 2015-05-29 DIAGNOSIS — M7532 Calcific tendinitis of left shoulder: Secondary | ICD-10-CM | POA: Insufficient documentation

## 2015-05-29 DIAGNOSIS — E785 Hyperlipidemia, unspecified: Secondary | ICD-10-CM | POA: Insufficient documentation

## 2015-05-29 DIAGNOSIS — F419 Anxiety disorder, unspecified: Secondary | ICD-10-CM | POA: Diagnosis not present

## 2015-05-29 DIAGNOSIS — E669 Obesity, unspecified: Secondary | ICD-10-CM | POA: Diagnosis not present

## 2015-05-29 DIAGNOSIS — G8918 Other acute postprocedural pain: Secondary | ICD-10-CM | POA: Diagnosis not present

## 2015-05-29 DIAGNOSIS — Z6832 Body mass index (BMI) 32.0-32.9, adult: Secondary | ICD-10-CM | POA: Insufficient documentation

## 2015-05-29 DIAGNOSIS — G2581 Restless legs syndrome: Secondary | ICD-10-CM | POA: Diagnosis not present

## 2015-05-29 DIAGNOSIS — G4730 Sleep apnea, unspecified: Secondary | ICD-10-CM | POA: Insufficient documentation

## 2015-05-29 DIAGNOSIS — Z79899 Other long term (current) drug therapy: Secondary | ICD-10-CM | POA: Diagnosis not present

## 2015-05-29 DIAGNOSIS — Z87891 Personal history of nicotine dependence: Secondary | ICD-10-CM | POA: Diagnosis not present

## 2015-05-29 DIAGNOSIS — M25512 Pain in left shoulder: Secondary | ICD-10-CM | POA: Diagnosis not present

## 2015-05-29 HISTORY — DX: Gastro-esophageal reflux disease without esophagitis: K21.9

## 2015-05-29 HISTORY — PX: SHOULDER ARTHROSCOPY: SHX128

## 2015-05-29 HISTORY — DX: Sleep apnea, unspecified: G47.30

## 2015-05-29 SURGERY — ARTHROSCOPY, SHOULDER
Anesthesia: General | Site: Shoulder | Laterality: Left

## 2015-05-29 MED ORDER — FENTANYL CITRATE (PF) 100 MCG/2ML IJ SOLN
50.0000 ug | INTRAMUSCULAR | Status: DC | PRN
  Administered 2015-05-29: 50 ug via INTRAVENOUS

## 2015-05-29 MED ORDER — SCOPOLAMINE 1 MG/3DAYS TD PT72: 1.0000 | MEDICATED_PATCH | Freq: Once | TRANSDERMAL | Status: DC | PRN

## 2015-05-29 MED ORDER — EPHEDRINE SULFATE 50 MG/ML IJ SOLN
INTRAMUSCULAR | Status: AC
  Filled 2015-05-29: qty 1

## 2015-05-29 MED ORDER — SODIUM CHLORIDE 0.9 % IR SOLN
Status: DC | PRN
  Administered 2015-05-29: 6000 mL

## 2015-05-29 MED ORDER — MIDAZOLAM HCL 2 MG/2ML IJ SOLN
1.0000 mg | INTRAMUSCULAR | Status: DC | PRN
  Administered 2015-05-29: 2 mg via INTRAVENOUS

## 2015-05-29 MED ORDER — ONDANSETRON HCL 4 MG/2ML IJ SOLN
INTRAMUSCULAR | Status: DC | PRN
  Administered 2015-05-29: 4 mg via INTRAVENOUS

## 2015-05-29 MED ORDER — FENTANYL CITRATE (PF) 100 MCG/2ML IJ SOLN
25.0000 ug | INTRAMUSCULAR | Status: DC | PRN
  Administered 2015-05-29: 25 ug via INTRAVENOUS
  Administered 2015-05-29: 50 ug via INTRAVENOUS

## 2015-05-29 MED ORDER — LIDOCAINE HCL (CARDIAC) 20 MG/ML IV SOLN
INTRAVENOUS | Status: DC | PRN
  Administered 2015-05-29: 50 mg via INTRAVENOUS

## 2015-05-29 MED ORDER — DOCUSATE SODIUM 100 MG PO CAPS: 100.0000 mg | ORAL_CAPSULE | Freq: Three times a day (TID) | ORAL | Status: DC | PRN

## 2015-05-29 MED ORDER — PROPOFOL 10 MG/ML IV BOLUS
INTRAVENOUS | Status: AC
  Filled 2015-05-29: qty 40

## 2015-05-29 MED ORDER — FENTANYL CITRATE (PF) 100 MCG/2ML IJ SOLN
INTRAMUSCULAR | Status: AC
  Filled 2015-05-29: qty 2

## 2015-05-29 MED ORDER — PROPOFOL 10 MG/ML IV BOLUS
INTRAVENOUS | Status: DC | PRN
  Administered 2015-05-29: 150 mg via INTRAVENOUS

## 2015-05-29 MED ORDER — DEXAMETHASONE SODIUM PHOSPHATE 4 MG/ML IJ SOLN
INTRAMUSCULAR | Status: DC | PRN
  Administered 2015-05-29: 10 mg via INTRAVENOUS

## 2015-05-29 MED ORDER — LACTATED RINGERS IV SOLN
INTRAVENOUS | Status: DC
  Administered 2015-05-29: 13:00:00 via INTRAVENOUS
  Administered 2015-05-29: 10 mL/h via INTRAVENOUS

## 2015-05-29 MED ORDER — METOCLOPRAMIDE HCL 5 MG/ML IJ SOLN: 10.0000 mg | Freq: Once | INTRAMUSCULAR | Status: DC | PRN

## 2015-05-29 MED ORDER — ATROPINE SULFATE 0.4 MG/ML IJ SOLN
INTRAMUSCULAR | Status: AC
  Filled 2015-05-29: qty 1

## 2015-05-29 MED ORDER — MEPERIDINE HCL 25 MG/ML IJ SOLN: 6.2500 mg | INTRAMUSCULAR | Status: DC | PRN

## 2015-05-29 MED ORDER — GLYCOPYRROLATE 0.2 MG/ML IJ SOLN
INTRAMUSCULAR | Status: AC
  Filled 2015-05-29: qty 1

## 2015-05-29 MED ORDER — ONDANSETRON HCL 4 MG/2ML IJ SOLN
INTRAMUSCULAR | Status: AC
  Filled 2015-05-29: qty 2

## 2015-05-29 MED ORDER — HYDROCODONE-ACETAMINOPHEN 7.5-325 MG PO TABS: 1.0000 | ORAL_TABLET | Freq: Once | ORAL | Status: DC | PRN

## 2015-05-29 MED ORDER — MIDAZOLAM HCL 2 MG/2ML IJ SOLN
INTRAMUSCULAR | Status: AC
  Filled 2015-05-29: qty 2

## 2015-05-29 MED ORDER — POVIDONE-IODINE 7.5 % EX SOLN: Freq: Once | CUTANEOUS | Status: DC

## 2015-05-29 MED ORDER — OXYCODONE-ACETAMINOPHEN 5-325 MG PO TABS: 1.0000 | ORAL_TABLET | ORAL | Status: DC | PRN

## 2015-05-29 MED ORDER — DEXAMETHASONE SODIUM PHOSPHATE 10 MG/ML IJ SOLN
INTRAMUSCULAR | Status: AC
  Filled 2015-05-29: qty 1

## 2015-05-29 MED ORDER — SUCCINYLCHOLINE CHLORIDE 20 MG/ML IJ SOLN
INTRAMUSCULAR | Status: DC | PRN
  Administered 2015-05-29: 50 mg via INTRAVENOUS

## 2015-05-29 MED ORDER — CEFAZOLIN SODIUM-DEXTROSE 2-3 GM-% IV SOLR
INTRAVENOUS | Status: AC
Start: 2015-05-29 — End: 2015-05-29
  Filled 2015-05-29: qty 50

## 2015-05-29 MED ORDER — SUCCINYLCHOLINE CHLORIDE 20 MG/ML IJ SOLN
INTRAMUSCULAR | Status: AC
  Filled 2015-05-29: qty 1

## 2015-05-29 MED ORDER — GLYCOPYRROLATE 0.2 MG/ML IJ SOLN: 0.2000 mg | Freq: Once | INTRAMUSCULAR | Status: DC | PRN

## 2015-05-29 MED ORDER — FENTANYL CITRATE (PF) 100 MCG/2ML IJ SOLN
INTRAMUSCULAR | Status: DC | PRN
  Administered 2015-05-29: 100 ug via INTRAVENOUS

## 2015-05-29 MED ORDER — EPHEDRINE SULFATE 50 MG/ML IJ SOLN
INTRAMUSCULAR | Status: DC | PRN
  Administered 2015-05-29 (×2): 10 mg via INTRAVENOUS

## 2015-05-29 MED ORDER — PHENYLEPHRINE HCL 10 MG/ML IJ SOLN
INTRAMUSCULAR | Status: AC
  Filled 2015-05-29: qty 1

## 2015-05-29 MED ORDER — MIDAZOLAM HCL 5 MG/5ML IJ SOLN
INTRAMUSCULAR | Status: DC | PRN
  Administered 2015-05-29: 2 mg via INTRAVENOUS

## 2015-05-29 MED ORDER — BUPIVACAINE-EPINEPHRINE (PF) 0.5% -1:200000 IJ SOLN
INTRAMUSCULAR | Status: DC | PRN
  Administered 2015-05-29: 30 mL via PERINEURAL

## 2015-05-29 MED ORDER — LIDOCAINE HCL (CARDIAC) 20 MG/ML IV SOLN
INTRAVENOUS | Status: AC
  Filled 2015-05-29: qty 5

## 2015-05-29 MED ORDER — CEFAZOLIN SODIUM-DEXTROSE 2-3 GM-% IV SOLR
2.0000 g | INTRAVENOUS | Status: AC
  Administered 2015-05-29: 2 g via INTRAVENOUS

## 2015-05-29 MED FILL — OXYCODONE/APAP 5-325: 5-325 | 4 days supply | Qty: 40 | Fill #0

## 2015-05-29 SURGICAL SUPPLY — 80 items
BENZOIN TINCTURE PRP APPL 2/3 (GAUZE/BANDAGES/DRESSINGS) IMPLANT
BLADE CLIPPER SURG (BLADE) IMPLANT
BLADE SURG 15 STRL LF DISP TIS (BLADE) IMPLANT
BLADE SURG 15 STRL SS (BLADE)
BUR OVAL 4.0 (BURR) ×3 IMPLANT
CANNULA 5.75X71 LONG (CANNULA) ×3 IMPLANT
CANNULA TWIST IN 8.25X7CM (CANNULA) IMPLANT
CHLORAPREP W/TINT 26ML (MISCELLANEOUS) ×3 IMPLANT
DECANTER SPIKE VIAL GLASS SM (MISCELLANEOUS) IMPLANT
DRAPE IMP U-DRAPE 54X76 (DRAPES) ×3 IMPLANT
DRAPE INCISE IOBAN 66X45 STRL (DRAPES) ×3 IMPLANT
DRAPE STERI 35X30 U-POUCH (DRAPES) ×3 IMPLANT
DRAPE SURG 17X23 STRL (DRAPES) ×3 IMPLANT
DRAPE U-SHAPE 47X51 STRL (DRAPES) ×3 IMPLANT
DRAPE U-SHAPE 76X120 STRL (DRAPES) ×6 IMPLANT
DRSG PAD ABDOMINAL 8X10 ST (GAUZE/BANDAGES/DRESSINGS) ×3 IMPLANT
ELECT REM PT RETURN 9FT ADLT (ELECTROSURGICAL) ×3
ELECTRODE REM PT RTRN 9FT ADLT (ELECTROSURGICAL) ×2 IMPLANT
GAUZE SPONGE 4X4 12PLY STRL (GAUZE/BANDAGES/DRESSINGS) ×3 IMPLANT
GAUZE SPONGE 4X4 16PLY XRAY LF (GAUZE/BANDAGES/DRESSINGS) IMPLANT
GAUZE XEROFORM 1X8 LF (GAUZE/BANDAGES/DRESSINGS) ×3 IMPLANT
GLOVE BIO SURGEON STRL SZ7 (GLOVE) ×3 IMPLANT
GLOVE BIO SURGEON STRL SZ7.5 (GLOVE) ×3 IMPLANT
GLOVE BIOGEL PI IND STRL 7.0 (GLOVE) ×6 IMPLANT
GLOVE BIOGEL PI IND STRL 8 (GLOVE) ×2 IMPLANT
GLOVE BIOGEL PI INDICATOR 7.0 (GLOVE) ×3
GLOVE BIOGEL PI INDICATOR 8 (GLOVE) ×1
GLOVE ECLIPSE 6.5 STRL STRAW (GLOVE) ×3 IMPLANT
GOWN STRL REUS W/ TWL LRG LVL3 (GOWN DISPOSABLE) ×4 IMPLANT
GOWN STRL REUS W/ TWL XL LVL3 (GOWN DISPOSABLE) ×2 IMPLANT
GOWN STRL REUS W/TWL LRG LVL3 (GOWN DISPOSABLE) ×2
GOWN STRL REUS W/TWL XL LVL3 (GOWN DISPOSABLE) ×1
LASSO CRESCENT QUICKPASS (SUTURE) IMPLANT
LIQUID BAND (GAUZE/BANDAGES/DRESSINGS) IMPLANT
MANIFOLD NEPTUNE II (INSTRUMENTS) ×3 IMPLANT
NDL SUT 6 .5 CRC .975X.05 MAYO (NEEDLE) IMPLANT
NEEDLE 1/2 CIR CATGUT .05X1.09 (NEEDLE) IMPLANT
NEEDLE MAYO TAPER (NEEDLE)
NEEDLE SCORPION MULTI FIRE (NEEDLE) IMPLANT
NS IRRIG 1000ML POUR BTL (IV SOLUTION) IMPLANT
PACK ARTHROSCOPY DSU (CUSTOM PROCEDURE TRAY) ×3 IMPLANT
PACK BASIN DAY SURGERY FS (CUSTOM PROCEDURE TRAY) ×3 IMPLANT
PENCIL BUTTON HOLSTER BLD 10FT (ELECTRODE) IMPLANT
RESECTOR FULL RADIUS 4.2MM (BLADE) ×3 IMPLANT
SHEET MEDIUM DRAPE 40X70 STRL (DRAPES) IMPLANT
SLEEVE SCD COMPRESS KNEE MED (MISCELLANEOUS) ×3 IMPLANT
SLING ARM FOAM STRAP LRG (SOFTGOODS) IMPLANT
SLING ARM FOAM STRAP MED (SOFTGOODS) ×3 IMPLANT
SLING ARM IMMOBILIZER MED (SOFTGOODS) IMPLANT
SLING ARM MED ADULT FOAM STRAP (SOFTGOODS) IMPLANT
SLING ARM XL FOAM STRAP (SOFTGOODS) IMPLANT
SPONGE LAP 4X18 X RAY DECT (DISPOSABLE) IMPLANT
STRIP CLOSURE SKIN 1/2X4 (GAUZE/BANDAGES/DRESSINGS) IMPLANT
SUCTION FRAZIER HANDLE 10FR (MISCELLANEOUS)
SUCTION TUBE FRAZIER 10FR DISP (MISCELLANEOUS) IMPLANT
SUPPORT WRAP ARM LG (MISCELLANEOUS) IMPLANT
SUT BONE WAX W31G (SUTURE) IMPLANT
SUT ETHIBOND 2 OS 4 DA (SUTURE) IMPLANT
SUT ETHILON 3 0 PS 1 (SUTURE) ×3 IMPLANT
SUT ETHILON 4 0 PS 2 18 (SUTURE) IMPLANT
SUT FIBERWIRE #2 38 T-5 BLUE (SUTURE)
SUT MNCRL AB 3-0 PS2 18 (SUTURE) IMPLANT
SUT MNCRL AB 4-0 PS2 18 (SUTURE) IMPLANT
SUT PDS AB 0 CT 36 (SUTURE) IMPLANT
SUT PROLENE 3 0 PS 2 (SUTURE) IMPLANT
SUT TIGER TAPE 7 IN WHITE (SUTURE) IMPLANT
SUT VIC AB 0 CT1 27 (SUTURE)
SUT VIC AB 0 CT1 27XBRD ANBCTR (SUTURE) IMPLANT
SUT VIC AB 2-0 SH 27 (SUTURE)
SUT VIC AB 2-0 SH 27XBRD (SUTURE) IMPLANT
SUTURE FIBERWR #2 38 T-5 BLUE (SUTURE) IMPLANT
SYR BULB 3OZ (MISCELLANEOUS) IMPLANT
TAPE FIBER 2MM 7IN #2 BLUE (SUTURE) IMPLANT
TOWEL OR 17X24 6PK STRL BLUE (TOWEL DISPOSABLE) ×3 IMPLANT
TOWEL OR NON WOVEN STRL DISP B (DISPOSABLE) ×3 IMPLANT
TUBE CONNECTING 20X1/4 (TUBING) ×3 IMPLANT
TUBING ARTHROSCOPY IRRIG 16FT (MISCELLANEOUS) ×3 IMPLANT
WAND STAR VAC 90 (SURGICAL WAND) ×3 IMPLANT
WATER STERILE IRR 1000ML POUR (IV SOLUTION) ×3 IMPLANT
YANKAUER SUCT BULB TIP NO VENT (SUCTIONS) IMPLANT

## 2015-05-29 NOTE — Anesthesia Preprocedure Evaluation (Addendum)
Anesthesia Evaluation  Patient identified by MRN, date of birth, ID band Patient awake    Reviewed: Allergy & Precautions, NPO status , Patient's Chart, lab work & pertinent test results  Airway Mallampati: II  TM Distance: >3 FB Neck ROM: Full    Dental no notable dental hx. (+) Teeth Intact   Pulmonary sleep apnea , former smoker,    Pulmonary exam normal breath sounds clear to auscultation       Cardiovascular negative cardio ROS Normal cardiovascular exam Rhythm:Regular Rate:Normal     Neuro/Psych PSYCHIATRIC DISORDERS Anxiety Depression Restless legs syndrome negative psych ROS   GI/Hepatic Neg liver ROS, GERD  Medicated and Controlled,  Endo/Other  Hyperlipidemia   Renal/GU negative Renal ROS  negative genitourinary   Musculoskeletal Left Shoulder calcific tendonitis   Abdominal (+) + obese,   Peds  Hematology   Anesthesia Other Findings   Reproductive/Obstetrics negative OB ROS                            Anesthesia Physical Anesthesia Plan  ASA: II  Anesthesia Plan: General   Post-op Pain Management: GA combined w/ Regional for post-op pain   Induction: Intravenous  Airway Management Planned: Oral ETT  Additional Equipment:   Intra-op Plan:   Post-operative Plan: Extubation in OR  Informed Consent: I have reviewed the patients History and Physical, chart, labs and discussed the procedure including the risks, benefits and alternatives for the proposed anesthesia with the patient or authorized representative who has indicated his/her understanding and acceptance.   Dental advisory given  Plan Discussed with: CRNA, Anesthesiologist and Surgeon  Anesthesia Plan Comments: (ISB for post op pain management)       Anesthesia Quick Evaluation

## 2015-05-29 NOTE — Transfer of Care (Signed)
Immediate Anesthesia Transfer of Care Note  Patient: Sherri Perry  Procedure(s) Performed: Procedure(s): LEFT SHOULDER ARTHROSCOPY WITH DEBRIDEMENT CALCIFIC TENDONITIS (Left)  Patient Location: PACU  Anesthesia Type:GA combined with regional for post-op pain  Level of Consciousness: awake and patient cooperative  Airway & Oxygen Therapy: Patient Spontanous Breathing and Patient connected to face mask oxygen  Post-op Assessment: Report given to RN and Post -op Vital signs reviewed and stable  Post vital signs: Reviewed and stable  Last Vitals:  Filed Vitals:   05/29/15 1230 05/29/15 1235  BP: 124/80   Pulse: 78 94  Temp:    Resp: 13 17    Complications: No apparent anesthesia complications

## 2015-05-29 NOTE — Progress Notes (Signed)
Assisted Dr. Turk with left, ultrasound guided, supraclavicular block. Side rails up, monitors on throughout procedure. See vital signs in flow sheet. Tolerated Procedure well. 

## 2015-05-29 NOTE — H&P (Signed)
Sherri Perry is an 50 y.o. female.   Chief Complaint: L shoulder pain HPI: L shoulder calcific tendonitis, failed nonop management  Past Medical History  Diagnosis Date  . Depression   . Anxiety   . Hyperlipemia   . RLS (restless legs syndrome)   . Snores   . Sleep apnea     mild, no CPAP needed  . GERD (gastroesophageal reflux disease)     Past Surgical History  Procedure Laterality Date  . Abdominal hysterectomy  4/15  . Carpal tunnel release      right and left  . Shoulder arthroscopy Right 02/14/2014    Procedure: ARTHROSCOPY SHOULDER;  Surgeon: Nita Sells, MD;  Location: [facility];  Service: Orthopedics;  Laterality: Right;  Right shoulder arthroscopy debridement calcific tendonitis, subacromial decompression,     History reviewed. No pertinent family history. Social History:  reports that she quit smoking about 21 years ago. She does not have any smokeless tobacco history on file. She reports that she drinks alcohol. She reports that she does not use illicit drugs.  Allergies: Not on File  Medications Prior to Admission  Medication Sig Dispense Refill  . atorvastatin (LIPITOR) 20 MG tablet Take 20 mg by mouth daily.    . Biotin 10 MG TABS Take by mouth.    Marland Kitchen buPROPion (WELLBUTRIN) 100 MG tablet Take 450 mg by mouth 2 (two) times daily.    Marland Kitchen FLUoxetine (PROZAC) 20 MG tablet Take 20 mg by mouth daily.    . Iron-Vit C-Vit B12-Folic Acid (IRON 123XX123 PLUS PO) Take by mouth.    . Multiple Vitamins-Minerals (MULTIVITAMIN WITH MINERALS) tablet Take 1 tablet by mouth daily.    . Omega-3 Fatty Acids (FISH OIL) 1000 MG CAPS Take by mouth.    Marland Kitchen rOPINIRole (REQUIP) 2 MG tablet Take 2 mg by mouth at bedtime.    . traZODone (DESYREL) 100 MG tablet Take 100 mg by mouth at bedtime.    . Turmeric 500 MG CAPS Take by mouth.      No results found for this or any previous visit (from the past 48 hour(s)). No results found.  Review of Systems  All  other systems reviewed and are negative.   Blood pressure 124/80, pulse 94, temperature 97 F (36.1 C), temperature source Oral, resp. rate 17, height 5\' 2"  (1.575 m), weight 79.379 kg (175 lb), SpO2 100 %. Physical Exam  Constitutional: She is oriented to person, place, and time. She appears well-developed and well-nourished.  HENT:  Head: Atraumatic.  Eyes: EOM are normal.  Cardiovascular: Intact distal pulses.   Respiratory: Effort normal.  Musculoskeletal:  L shoulder pain with RC testing.  Neurological: She is alert and oriented to person, place, and time.  Skin: Skin is warm and dry.  Psychiatric: She has a normal mood and affect.     Assessment/Plan Left shoulder calcific tendinitis, failed nonoperative management Plan left shoulder arthroscopic debridement calcific tendinitis, possible rotator cuff repair, possible subacromial decompression Risks / benefits of surgery discussed Consent on chart  NPO for OR Preop antibiotics   Nita Sells, MD 05/29/2015, 12:51 PM

## 2015-05-29 NOTE — Op Note (Signed)
Procedure(s): LEFT SHOULDER ARTHROSCOPY WITH DEBRIDEMENT CALCIFIC TENDONITIS Procedure Note  Sherri Perry female 50 y.o. 05/29/2015  Procedure(s) and Anesthesia Type:    * LEFT SHOULDER ARTHROSCOPY WITH DEBRIDEMENT CALCIFIC TENDONITIS - General  Surgeon(s) and Role:    * Tania Ade, MD - Primary     Surgeon: Nita Sells   Assistants: Jeanmarie Hubert PA-C (Danielle was present and scrubbed throughout the procedure and was essential in positioning, assisting with the camera and instrumentation,, and closure)  Anesthesia: General endotracheal anesthesia with preoperative interscalene block given by the attending anesthesiologist    Procedure Detail  LEFT SHOULDER ARTHROSCOPY WITH DEBRIDEMENT CALCIFIC TENDONITIS  Estimated Blood Loss: Min         Drains: none  Blood Given: none         Specimens: none        Complications:  * No complications entered in OR log *         Disposition: PACU - hemodynamically stable.         Condition: stable    Procedure:   INDICATIONS FOR SURGERY: The patient is 50 y.o. female who has had a long history of left shoulder pain with refractory calcific tendinitis, failed corticosteroid injections and attempt at ultrasound-guided aspiration.  OPERATIVE FINDINGS: Examination under anesthesia: No stiffness or instability Diagnostic Arthroscopy:   DESCRIPTION OF PROCEDURE: The patient was identified in preoperative  holding area where I personally marked the operative site after  verifying site, side, and procedure with the patient. An interscalene block was given by the attending anesthesiologist the holding area.  The patient was taken back to the operating room where general anesthesia was induced without complication and was placed in the beach-chair position with the back  elevated about 60 degrees and all extremities and head and neck carefully padded and  positioned.   The left upper extremity was then  prepped and  draped in a standard sterile fashion. The appropriate time-out  procedure was carried out. The patient did receive IV antibiotics  within 30 minutes of incision.   A small posterior portal incision was made and the arthroscope was introduced into the joint. An anterior portal was then established above the subscapularis using needle localization. Small cannula was placed anteriorly. Diagnostic arthroscopy was then carried out.  Diagnostic arthroscopy in the joint was fairly unremarkable. Mild fraying of the superior labrum which was debrided. The rotator cuff was completely intact. Glenohumeral joint surfaces were intact. Mild synovitis of the biceps but no tearing.  The arthroscope was then introduced into the subacromial space a standard lateral portal was established with needle localization. The shaver was used through the lateral portal to perform extensive bursectomy.   The rotator cuff is noted to be intact. Anteriorly she was noted to have a very superficial calcific deposit just off the anterior lateral insertion of the rotator cuff. An 11 blade was used through the lateral portal to longitudinally open the thin layer of tissue over the calcium. The shaver was then used to resect all of the calcium. A probe was used to ensure all of the small pockets of calcium were removed. The thin layer over the rotator cuff was nothing that required any fixation after debridement.  The arthroscopic equipment was removed from the joint and the portals were closed with 3-0 nylon in an interrupted fashion. Sterile dressings were then applied including Xeroform 4 x 4's ABDs and tape. The patient was then allowed to awaken from general anesthesia, placed in a  sling, transferred to the stretcher and taken to the recovery room in stable condition.   POSTOPERATIVE PLAN: The patient will be discharged home today and will followup in one week for suture removal and wound check.  We will get her into  therapy right away.

## 2015-05-29 NOTE — Anesthesia Procedure Notes (Addendum)
Anesthesia Regional Block:  Interscalene brachial plexus block  Pre-Anesthetic Checklist: ,, timeout performed, Correct Patient, Correct Site, Correct Laterality, Correct Procedure, Correct Position, site marked, Risks and benefits discussed,  Surgical consent,  Pre-op evaluation,  At surgeon's request and post-op pain management  Laterality: Left  Prep: chloraprep       Needles:  Injection technique: Single-shot  Needle Type: Echogenic Stimulator Needle     Needle Length: 9cm 9 cm Needle Gauge: 21 and 21 G  Needle insertion depth: 3 cm   Additional Needles:  Procedures: ultrasound guided (picture in chart) Interscalene brachial plexus block Narrative:   Performed by: Personally  Anesthesiologist: FOSTER, MICHAEL  Additional Notes: Patient tolerated procedure well.   Procedure Name: Intubation Date/Time: 05/29/2015 1:01 PM Performed by: Toula Moos L Pre-anesthesia Checklist: Patient identified, Emergency Drugs available, Suction available, Patient being monitored and Timeout performed Patient Re-evaluated:Patient Re-evaluated prior to inductionOxygen Delivery Method: Circle System Utilized Preoxygenation: Pre-oxygenation with 100% oxygen Intubation Type: IV induction Ventilation: Mask ventilation without difficulty Laryngoscope Size: Miller and 3 Grade View: Grade III Tube type: Oral Tube size: 7.0 mm Number of attempts: 1 Airway Equipment and Method: Stylet and Oral airway Placement Confirmation: ETT inserted through vocal cords under direct vision,  positive ETCO2 and breath sounds checked- equal and bilateral Secured at: 21 cm Tube secured with: Tape Dental Injury: Teeth and Oropharynx as per pre-operative assessment

## 2015-05-29 NOTE — Discharge Instructions (Signed)
Discharge Instructions after Arthroscopic Shoulder Surgery ° ° °A sling has been provided for you. You may remove the sling after 72 hours. The sling may be worn for your protection, if you are in a crowd.  °Use ice on the shoulder intermittently over the first 48 hours after surgery.  °Pain medication has been prescribed for you.  °Use your medication liberally over the first 48 hours, and then begin to taper your use. You may take Extra Strength Tylenol or Tylenol only in place of the pain pills. DO NOT take ANY nonsteroidal anti-inflammatory pain medications: Advil, Motrin, Ibuprofen, Aleve, Naproxen, or Naprosyn.  °You may remove your dressing after two days.  °You may shower 5 days after surgery. The incision CANNOT get wet prior to 5 days. Simply allow the water to wash over the site and then pat dry. Do not rub the incision. Make sure your axilla (armpit) is completely dry after showering.  °Take one aspirin a day for 2 weeks after surgery, unless you have an aspirin sensitivity/allergy or asthma.  °Three to 5 times each day you should perform assisted overhead reaching and external rotation (outward turning) exercises with the operative arm. Both exercises should be done with the non-operative arm used as the "therapist arm" while the operative arm remains relaxed. Ten of each exercise should be done three to five times each day. ° ° ° °Overhead reach is helping to lift your stiff arm up as high as it will go. To stretch your overhead reach, lie flat on your back, relax, and grasp the wrist of the tight shoulder with your opposite hand. Using the power in your opposite arm, bring the stiff arm up as far as it is comfortable. Start holding it for ten seconds and then work up to where you can hold it for a count of 30. Breathe slowly and deeply while the arm is moved. Repeat this stretch ten times, trying to help the arm up a little higher each time.  ° ° ° ° ° °External rotation is turning the arm out to  the side while your elbow stays close to your body. External rotation is best stretched while you are lying on your back. Hold a cane, yardstick, broom handle, or dowel in both hands. Bend both elbows to a right angle. Use steady, gentle force from your normal arm to rotate the hand of the stiff shoulder out away from your body. Continue the rotation as far as it will go comfortably, holding it there for a count of 10. Repeat this exercise ten times.  ° ° ° °Please call [phone] during normal business hours or [phone] after hours for any problems. Including the following: ° °- excessive redness of the incisions °- drainage for more than 4 days °- fever of more than 101.5 F ° °*Please note that pain medications will not be refilled after hours or on weekends. ° ° ° °Post Anesthesia Home Care Instructions ° °Activity: °Get plenty of rest for the remainder of the day. A responsible adult should stay with you for 24 hours following the procedure.  °For the next 24 hours, DO NOT: °-Drive a car °-Operate machinery °-Drink alcoholic beverages °-Take any medication unless instructed by your physician °-Make any legal decisions or sign important papers. ° °Meals: °Start with liquid foods such as gelatin or soup. Progress to regular foods as tolerated. Avoid greasy, spicy, heavy foods. If nausea and/or vomiting occur, drink only clear liquids until the nausea and/or vomiting subsides. Call   your physician if vomiting continues. ° °Special Instructions/Symptoms: °Your throat may feel dry or sore from the anesthesia or the breathing tube placed in your throat during surgery. If this causes discomfort, gargle with warm salt water. The discomfort should disappear within 24 hours. ° °If you had a scopolamine patch placed behind your ear for the management of post- operative nausea and/or vomiting: ° °1. The medication in the patch is effective for 72 hours, after which it should be removed.  Wrap patch in a tissue and  discard in the trash. Wash hands thoroughly with soap and water. °2. You may remove the patch earlier than 72 hours if you experience unpleasant side effects which may include dry mouth, dizziness or visual disturbances. °3. Avoid touching the patch. Wash your hands with soap and water after contact with the patch. ° ° °  °Regional Anesthesia Blocks ° °1. Numbness or the inability to move the "blocked" extremity may last from 3-48 hours after placement. The length of time depends on the medication injected and your individual response to the medication. If the numbness is not going away after 48 hours, call your surgeon. ° °2. The extremity that is blocked will need to be protected until the numbness is gone and the  Strength has returned. Because you cannot feel it, you will need to take extra care to avoid injury. Because it may be weak, you may have difficulty moving it or using it. You may not know what position it is in without looking at it while the block is in effect. ° °3. For blocks in the legs and feet, returning to weight bearing and walking needs to be done carefully. You will need to wait until the numbness is entirely gone and the strength has returned. You should be able to move your leg and foot normally before you try and bear weight or walk. You will need someone to be with you when you first try to ensure you do not fall and possibly risk injury. ° °4. Bruising and tenderness at the needle site are common side effects and will resolve in a few days. ° °5. Persistent numbness or new problems with movement should be communicated to the surgeon or the [facility] ([phone])/ [facility] ([phone]). °

## 2015-05-30 ENCOUNTER — Encounter (HOSPITAL_BASED_OUTPATIENT_CLINIC_OR_DEPARTMENT_OTHER): Payer: Self-pay | Admitting: Orthopedic Surgery

## 2015-05-30 NOTE — Anesthesia Postprocedure Evaluation (Signed)
Anesthesia Post Note  Patient: MEILYN JOSUE  Procedure(s) Performed: Procedure(s) (LRB): ARTHROSCOPY SHOULDER (Left)  Patient location during evaluation: PACU Anesthesia Type: General Level of consciousness: awake and alert and oriented Pain management: pain level controlled Vital Signs Assessment: post-procedure vital signs reviewed and stable Respiratory status: spontaneous breathing, nonlabored ventilation and respiratory function stable Cardiovascular status: blood pressure returned to baseline and stable Postop Assessment: no signs of nausea or vomiting Anesthetic complications: no    Last Vitals:  Filed Vitals:   05/29/15 1445 05/29/15 1500  BP:  123/60  Pulse: 89 95  Temp:  36.5 C  Resp: 16 18    Last Pain:  Filed Vitals:   05/29/15 1505  PainSc: 2                  Aryka Coonradt A.

## 2015-06-07 DIAGNOSIS — M7532 Calcific tendinitis of left shoulder: Secondary | ICD-10-CM | POA: Diagnosis not present

## 2015-06-12 MED FILL — traZODone HCL 50 MG TABS: 50 | 90 days supply | Qty: 90 | Fill #2

## 2015-06-13 MED FILL — ATORVASTATIN 10 MG TABLET: 10 | 30 days supply | Qty: 30 | Fill #0

## 2015-06-21 DIAGNOSIS — H524 Presbyopia: Secondary | ICD-10-CM | POA: Diagnosis not present

## 2015-06-21 DIAGNOSIS — H52223 Regular astigmatism, bilateral: Secondary | ICD-10-CM | POA: Diagnosis not present

## 2015-06-22 MED FILL — FLUoxetine HCL 20 MG CAPS: 20 | 90 days supply | Qty: 90 | Fill #0

## 2015-07-05 DIAGNOSIS — M7532 Calcific tendinitis of left shoulder: Secondary | ICD-10-CM | POA: Diagnosis not present

## 2015-07-18 MED FILL — rOPINIRole HCL 1 MG TABS: 1 | 90 days supply | Qty: 135 | Fill #1

## 2015-07-18 MED FILL — ATORVASTATIN 10 MG TABLET: 10 | 30 days supply | Qty: 30 | Fill #1

## 2015-07-18 MED FILL — BUPROPION HCL XL 150 MG TAB: 150 | 90 days supply | Qty: 270 | Fill #0

## 2015-08-24 DIAGNOSIS — H6983 Other specified disorders of Eustachian tube, bilateral: Secondary | ICD-10-CM | POA: Diagnosis not present

## 2015-08-30 DIAGNOSIS — H5202 Hypermetropia, left eye: Secondary | ICD-10-CM | POA: Diagnosis not present

## 2015-08-30 DIAGNOSIS — H524 Presbyopia: Secondary | ICD-10-CM | POA: Diagnosis not present

## 2015-08-30 DIAGNOSIS — H2513 Age-related nuclear cataract, bilateral: Secondary | ICD-10-CM | POA: Diagnosis not present

## 2015-08-30 DIAGNOSIS — H52222 Regular astigmatism, left eye: Secondary | ICD-10-CM | POA: Diagnosis not present

## 2015-08-30 DIAGNOSIS — H52221 Regular astigmatism, right eye: Secondary | ICD-10-CM | POA: Diagnosis not present

## 2015-09-18 DIAGNOSIS — H6062 Unspecified chronic otitis externa, left ear: Secondary | ICD-10-CM | POA: Diagnosis not present

## 2015-09-21 MED FILL — traZODone HCL 50 MG TABS: 50 | 90 days supply | Qty: 90 | Fill #3

## 2015-09-21 MED FILL — FLUoxetine HCL 20 MG CAPS: 20 | 90 days supply | Qty: 90 | Fill #1 | Status: TO

## 2015-09-21 MED FILL — BUPROPION HCL XL 150 MG TAB: 150 | 90 days supply | Qty: 270 | Fill #1

## 2015-09-21 MED FILL — rOPINIRole HCL 1 MG TABS: 1 | 90 days supply | Qty: 135 | Fill #2

## 2015-11-08 DIAGNOSIS — L281 Prurigo nodularis: Secondary | ICD-10-CM | POA: Diagnosis not present

## 2015-11-08 DIAGNOSIS — D239 Other benign neoplasm of skin, unspecified: Secondary | ICD-10-CM | POA: Diagnosis not present

## 2015-12-12 DIAGNOSIS — Z6829 Body mass index (BMI) 29.0-29.9, adult: Secondary | ICD-10-CM | POA: Diagnosis not present

## 2015-12-12 DIAGNOSIS — Z01419 Encounter for gynecological examination (general) (routine) without abnormal findings: Secondary | ICD-10-CM | POA: Diagnosis not present

## 2015-12-12 DIAGNOSIS — N760 Acute vaginitis: Secondary | ICD-10-CM | POA: Diagnosis not present

## 2015-12-13 DIAGNOSIS — Z0000 Encounter for general adult medical examination without abnormal findings: Secondary | ICD-10-CM | POA: Diagnosis not present

## 2015-12-13 DIAGNOSIS — G2581 Restless legs syndrome: Secondary | ICD-10-CM | POA: Diagnosis not present

## 2015-12-13 DIAGNOSIS — Z79899 Other long term (current) drug therapy: Secondary | ICD-10-CM | POA: Diagnosis not present

## 2015-12-13 DIAGNOSIS — E784 Other hyperlipidemia: Secondary | ICD-10-CM | POA: Diagnosis not present

## 2015-12-15 MED FILL — rOPINIRole HCL 1 MG TABS: 1 | 90 days supply | Qty: 135 | Fill #3

## 2015-12-20 DIAGNOSIS — M5383 Other specified dorsopathies, cervicothoracic region: Secondary | ICD-10-CM | POA: Diagnosis not present

## 2015-12-20 DIAGNOSIS — M9905 Segmental and somatic dysfunction of pelvic region: Secondary | ICD-10-CM | POA: Diagnosis not present

## 2015-12-20 DIAGNOSIS — M9902 Segmental and somatic dysfunction of thoracic region: Secondary | ICD-10-CM | POA: Diagnosis not present

## 2015-12-20 DIAGNOSIS — D2271 Melanocytic nevi of right lower limb, including hip: Secondary | ICD-10-CM | POA: Diagnosis not present

## 2015-12-20 DIAGNOSIS — M9904 Segmental and somatic dysfunction of sacral region: Secondary | ICD-10-CM | POA: Diagnosis not present

## 2015-12-20 DIAGNOSIS — M5137 Other intervertebral disc degeneration, lumbosacral region: Secondary | ICD-10-CM | POA: Diagnosis not present

## 2015-12-20 DIAGNOSIS — M9903 Segmental and somatic dysfunction of lumbar region: Secondary | ICD-10-CM | POA: Diagnosis not present

## 2015-12-20 DIAGNOSIS — D485 Neoplasm of uncertain behavior of skin: Secondary | ICD-10-CM | POA: Diagnosis not present

## 2015-12-20 DIAGNOSIS — D2272 Melanocytic nevi of left lower limb, including hip: Secondary | ICD-10-CM | POA: Diagnosis not present

## 2015-12-20 DIAGNOSIS — D2262 Melanocytic nevi of left upper limb, including shoulder: Secondary | ICD-10-CM | POA: Diagnosis not present

## 2015-12-20 DIAGNOSIS — Q72812 Congenital shortening of left lower limb: Secondary | ICD-10-CM | POA: Diagnosis not present

## 2015-12-20 DIAGNOSIS — D2372 Other benign neoplasm of skin of left lower limb, including hip: Secondary | ICD-10-CM | POA: Diagnosis not present

## 2015-12-20 DIAGNOSIS — M9901 Segmental and somatic dysfunction of cervical region: Secondary | ICD-10-CM | POA: Diagnosis not present

## 2015-12-20 DIAGNOSIS — L821 Other seborrheic keratosis: Secondary | ICD-10-CM | POA: Diagnosis not present

## 2015-12-20 DIAGNOSIS — D235 Other benign neoplasm of skin of trunk: Secondary | ICD-10-CM | POA: Diagnosis not present

## 2015-12-28 DIAGNOSIS — M5383 Other specified dorsopathies, cervicothoracic region: Secondary | ICD-10-CM | POA: Diagnosis not present

## 2015-12-28 DIAGNOSIS — M9901 Segmental and somatic dysfunction of cervical region: Secondary | ICD-10-CM | POA: Diagnosis not present

## 2015-12-28 DIAGNOSIS — M9902 Segmental and somatic dysfunction of thoracic region: Secondary | ICD-10-CM | POA: Diagnosis not present

## 2015-12-28 DIAGNOSIS — M5137 Other intervertebral disc degeneration, lumbosacral region: Secondary | ICD-10-CM | POA: Diagnosis not present

## 2015-12-28 DIAGNOSIS — M9905 Segmental and somatic dysfunction of pelvic region: Secondary | ICD-10-CM | POA: Diagnosis not present

## 2015-12-28 DIAGNOSIS — M9903 Segmental and somatic dysfunction of lumbar region: Secondary | ICD-10-CM | POA: Diagnosis not present

## 2015-12-28 DIAGNOSIS — Q72812 Congenital shortening of left lower limb: Secondary | ICD-10-CM | POA: Diagnosis not present

## 2015-12-28 DIAGNOSIS — M9904 Segmental and somatic dysfunction of sacral region: Secondary | ICD-10-CM | POA: Diagnosis not present

## 2016-01-09 MED FILL — traZODone HCL 50 MG TABS: 50 | 90 days supply | Qty: 90 | Fill #0

## 2016-01-12 DIAGNOSIS — H5202 Hypermetropia, left eye: Secondary | ICD-10-CM | POA: Diagnosis not present

## 2016-01-12 DIAGNOSIS — H52221 Regular astigmatism, right eye: Secondary | ICD-10-CM | POA: Diagnosis not present

## 2016-01-12 DIAGNOSIS — H2513 Age-related nuclear cataract, bilateral: Secondary | ICD-10-CM | POA: Diagnosis not present

## 2016-01-12 DIAGNOSIS — H04123 Dry eye syndrome of bilateral lacrimal glands: Secondary | ICD-10-CM | POA: Diagnosis not present

## 2016-01-12 DIAGNOSIS — H52222 Regular astigmatism, left eye: Secondary | ICD-10-CM | POA: Diagnosis not present

## 2016-02-12 DIAGNOSIS — M5137 Other intervertebral disc degeneration, lumbosacral region: Secondary | ICD-10-CM | POA: Diagnosis not present

## 2016-02-12 DIAGNOSIS — M5383 Other specified dorsopathies, cervicothoracic region: Secondary | ICD-10-CM | POA: Diagnosis not present

## 2016-02-12 DIAGNOSIS — Q72812 Congenital shortening of left lower limb: Secondary | ICD-10-CM | POA: Diagnosis not present

## 2016-02-12 DIAGNOSIS — M9902 Segmental and somatic dysfunction of thoracic region: Secondary | ICD-10-CM | POA: Diagnosis not present

## 2016-02-12 DIAGNOSIS — M9901 Segmental and somatic dysfunction of cervical region: Secondary | ICD-10-CM | POA: Diagnosis not present

## 2016-02-12 DIAGNOSIS — M9905 Segmental and somatic dysfunction of pelvic region: Secondary | ICD-10-CM | POA: Diagnosis not present

## 2016-02-12 DIAGNOSIS — M9904 Segmental and somatic dysfunction of sacral region: Secondary | ICD-10-CM | POA: Diagnosis not present

## 2016-02-12 DIAGNOSIS — M9903 Segmental and somatic dysfunction of lumbar region: Secondary | ICD-10-CM | POA: Diagnosis not present

## 2016-02-13 DIAGNOSIS — M9904 Segmental and somatic dysfunction of sacral region: Secondary | ICD-10-CM | POA: Diagnosis not present

## 2016-02-13 DIAGNOSIS — M5137 Other intervertebral disc degeneration, lumbosacral region: Secondary | ICD-10-CM | POA: Diagnosis not present

## 2016-02-13 DIAGNOSIS — M9905 Segmental and somatic dysfunction of pelvic region: Secondary | ICD-10-CM | POA: Diagnosis not present

## 2016-02-13 DIAGNOSIS — Q72812 Congenital shortening of left lower limb: Secondary | ICD-10-CM | POA: Diagnosis not present

## 2016-02-13 DIAGNOSIS — M9901 Segmental and somatic dysfunction of cervical region: Secondary | ICD-10-CM | POA: Diagnosis not present

## 2016-02-13 DIAGNOSIS — M9902 Segmental and somatic dysfunction of thoracic region: Secondary | ICD-10-CM | POA: Diagnosis not present

## 2016-02-13 DIAGNOSIS — M5383 Other specified dorsopathies, cervicothoracic region: Secondary | ICD-10-CM | POA: Diagnosis not present

## 2016-02-13 DIAGNOSIS — M9903 Segmental and somatic dysfunction of lumbar region: Secondary | ICD-10-CM | POA: Diagnosis not present

## 2016-02-19 DIAGNOSIS — M9903 Segmental and somatic dysfunction of lumbar region: Secondary | ICD-10-CM | POA: Diagnosis not present

## 2016-02-19 DIAGNOSIS — M5137 Other intervertebral disc degeneration, lumbosacral region: Secondary | ICD-10-CM | POA: Diagnosis not present

## 2016-02-19 DIAGNOSIS — M9904 Segmental and somatic dysfunction of sacral region: Secondary | ICD-10-CM | POA: Diagnosis not present

## 2016-02-19 DIAGNOSIS — M9905 Segmental and somatic dysfunction of pelvic region: Secondary | ICD-10-CM | POA: Diagnosis not present

## 2016-02-19 DIAGNOSIS — M9902 Segmental and somatic dysfunction of thoracic region: Secondary | ICD-10-CM | POA: Diagnosis not present

## 2016-02-19 DIAGNOSIS — M9901 Segmental and somatic dysfunction of cervical region: Secondary | ICD-10-CM | POA: Diagnosis not present

## 2016-02-19 DIAGNOSIS — Q72812 Congenital shortening of left lower limb: Secondary | ICD-10-CM | POA: Diagnosis not present

## 2016-02-19 DIAGNOSIS — M5383 Other specified dorsopathies, cervicothoracic region: Secondary | ICD-10-CM | POA: Diagnosis not present

## 2016-03-26 MED FILL — BUPROPION HCL XL 150 MG TAB: 150 | 90 days supply | Qty: 270 | Fill #2

## 2016-03-26 MED FILL — ATORVASTATIN 10 MG TABLET: 10 | 30 days supply | Qty: 30 | Fill #2

## 2016-04-03 ENCOUNTER — Other Ambulatory Visit: Payer: Self-pay | Admitting: Family

## 2016-04-03 DIAGNOSIS — Z1231 Encounter for screening mammogram for malignant neoplasm of breast: Secondary | ICD-10-CM

## 2016-04-09 MED FILL — traZODone HCL 50 MG TABS: 50 | 90 days supply | Qty: 90 | Fill #1

## 2016-04-13 ENCOUNTER — Telehealth: Payer: 59 | Admitting: Nurse Practitioner

## 2016-04-13 DIAGNOSIS — R05 Cough: Secondary | ICD-10-CM | POA: Diagnosis not present

## 2016-04-13 DIAGNOSIS — R059 Cough, unspecified: Secondary | ICD-10-CM

## 2016-04-13 MED ORDER — AZITHROMYCIN 250 MG PO TABS: ORAL_TABLET | ORAL | 0 refills | Status: DC

## 2016-04-13 MED ORDER — BENZONATATE 100 MG PO CAPS: 100.0000 mg | ORAL_CAPSULE | Freq: Three times a day (TID) | ORAL | 0 refills | Status: DC | PRN

## 2016-04-13 NOTE — Progress Notes (Signed)

## 2016-04-22 MED FILL — ATORVASTATIN 10 MG TABLET: 10 | 30 days supply | Qty: 30 | Fill #3

## 2016-05-01 ENCOUNTER — Ambulatory Visit
Admission: RE | Admit: 2016-05-01 | Discharge: 2016-05-01 | Disposition: A | Discharge status: Home / Self Care | Payer: 59 | Source: Ambulatory Visit | Attending: Family | Admitting: Family

## 2016-05-01 ENCOUNTER — Telehealth: Payer: 59 | Admitting: Family

## 2016-05-01 DIAGNOSIS — Z1231 Encounter for screening mammogram for malignant neoplasm of breast: Secondary | ICD-10-CM

## 2016-05-01 DIAGNOSIS — R05 Cough: Secondary | ICD-10-CM | POA: Diagnosis not present

## 2016-05-01 DIAGNOSIS — R059 Cough, unspecified: Secondary | ICD-10-CM

## 2016-05-01 MED ORDER — BENZONATATE 100 MG PO CAPS: 100.0000 mg | ORAL_CAPSULE | Freq: Three times a day (TID) | ORAL | 0 refills | Status: DC | PRN

## 2016-05-01 MED ORDER — FLUTICASONE PROPIONATE 50 MCG/ACT NA SUSP: 2.0000 | Freq: Every day | NASAL | 6 refills | Status: DC

## 2016-05-01 NOTE — Progress Notes (Signed)
We are sorry that you are not feeling well.  Here is how we plan to help!  Based on what you have shared with me it looks like you have upper respiratory tract inflammation that has resulted in a significant cough.  Inflammation and infection in the upper respiratory tract is commonly called bronchitis and has four common causes:  Allergies, Viral Infections, Acid Reflux and Bacterial Infections.  Allergies, viruses and acid reflux are treated by controlling symptoms or eliminating the cause. An example might be a cough caused by taking certain blood pressure medications. You stop the cough by changing the medication. Another example might be a cough caused by acid reflux. Controlling the reflux helps control the cough.  Based on your presentation I believe you most likely have A cough due to a virus.  This is called viral bronchitis and is best treated by rest, plenty of fluids and control of the cough.  You may use Ibuprofen or Tylenol as directed to help your symptoms.     In addition you may use A non-prescription cough medication called Robitussin DAC. Take 2 teaspoons every 8 hours or Delsym: take 2 teaspoons every 12 hours., A non-prescription cough medication called Mucinex DM: take 2 tablets every 12 hours. and A prescription cough medication called Tessalon Perles 100mg . You may take 1-2 capsules every 8 hours as needed for your cough. Flonase nasal spray, use two sprays in each nostril daily.     USE OF BRONCHODILATOR ("RESCUE") INHALERS: There is a risk from using your bronchodilator too frequently.  The risk is that over-reliance on a medication which only relaxes the muscles surrounding the breathing tubes can reduce the effectiveness of medications prescribed to reduce swelling and congestion of the tubes themselves.  Although you feel brief relief from the bronchodilator inhaler, your asthma may actually be worsening with the tubes becoming more swollen and filled with mucus.  This can  delay other crucial treatments, such as oral steroid medications. If you need to use a bronchodilator inhaler daily, several times per day, you should discuss this with your provider.  There are probably better treatments that could be used to keep your asthma under control.     HOME CARE . Only take medications as instructed by your medical team. . Complete the entire course of an antibiotic. . Drink plenty of fluids and get plenty of rest. . Avoid close contacts especially the very young and the elderly . Cover your mouth if you cough or cough into your sleeve. . Always remember to wash your hands . A steam or ultrasonic humidifier can help congestion.   GET HELP RIGHT AWAY IF: . You develop worsening fever. . You become short of breath . You cough up blood. . Your symptoms persist after you have completed your treatment plan MAKE SURE YOU   Understand these instructions.  Will watch your condition.  Will get help right away if you are not doing well or get worse.  Your e-visit answers were reviewed by a board certified advanced clinical practitioner to complete your personal care plan.  Depending on the condition, your plan could have included both over the counter or prescription medications. If there is a problem please reply  once you have received a response from your provider. Your safety is important to [place].  If you have drug allergies check your prescription carefully.    You can use MyChart to ask questions about today's visit, request a non-urgent call back, or ask for  a work or school excuse for 24 hours related to this e-Visit. If it has been greater than 24 hours you will need to follow up with your provider, or enter a new e-Visit to address those concerns. You will get an e-mail in the next two days asking about your experience.  I hope that your e-visit has been valuable and will speed your recovery. Thank you for using e-visits.

## 2016-05-03 ENCOUNTER — Other Ambulatory Visit: Payer: Self-pay | Admitting: Family

## 2016-05-03 DIAGNOSIS — R928 Other abnormal and inconclusive findings on diagnostic imaging of breast: Secondary | ICD-10-CM

## 2016-05-15 ENCOUNTER — Ambulatory Visit
Admission: RE | Admit: 2016-05-15 | Discharge: 2016-05-15 | Disposition: A | Discharge status: Home / Self Care | Payer: 59 | Source: Ambulatory Visit | Attending: Family | Admitting: Family

## 2016-05-15 DIAGNOSIS — R928 Other abnormal and inconclusive findings on diagnostic imaging of breast: Secondary | ICD-10-CM

## 2016-05-15 DIAGNOSIS — R922 Inconclusive mammogram: Secondary | ICD-10-CM | POA: Diagnosis not present

## 2016-05-15 DIAGNOSIS — F5089 Other specified eating disorder: Secondary | ICD-10-CM | POA: Diagnosis not present

## 2016-05-19 DIAGNOSIS — F5089 Other specified eating disorder: Secondary | ICD-10-CM | POA: Diagnosis not present

## 2016-05-29 DIAGNOSIS — R413 Other amnesia: Secondary | ICD-10-CM | POA: Diagnosis not present

## 2016-05-29 DIAGNOSIS — Z79899 Other long term (current) drug therapy: Secondary | ICD-10-CM | POA: Diagnosis not present

## 2016-05-29 DIAGNOSIS — E784 Other hyperlipidemia: Secondary | ICD-10-CM | POA: Diagnosis not present

## 2016-06-04 MED FILL — rOPINIRole HCL 1 MG TABS: 1 | 30 days supply | Qty: 30 | Fill #0

## 2016-06-09 DIAGNOSIS — F5089 Other specified eating disorder: Secondary | ICD-10-CM | POA: Diagnosis not present

## 2016-06-11 DIAGNOSIS — F3342 Major depressive disorder, recurrent, in full remission: Secondary | ICD-10-CM | POA: Diagnosis not present

## 2016-06-11 DIAGNOSIS — F4322 Adjustment disorder with anxiety: Secondary | ICD-10-CM | POA: Diagnosis not present

## 2016-06-11 DIAGNOSIS — F900 Attention-deficit hyperactivity disorder, predominantly inattentive type: Secondary | ICD-10-CM | POA: Diagnosis not present

## 2016-06-11 DIAGNOSIS — F331 Major depressive disorder, recurrent, moderate: Secondary | ICD-10-CM | POA: Diagnosis not present

## 2016-06-14 DIAGNOSIS — F5089 Other specified eating disorder: Secondary | ICD-10-CM | POA: Diagnosis not present

## 2016-06-16 DIAGNOSIS — F5089 Other specified eating disorder: Secondary | ICD-10-CM | POA: Diagnosis not present

## 2016-06-18 DIAGNOSIS — F5089 Other specified eating disorder: Secondary | ICD-10-CM | POA: Diagnosis not present

## 2016-06-25 DIAGNOSIS — F5089 Other specified eating disorder: Secondary | ICD-10-CM | POA: Diagnosis not present

## 2016-06-27 DIAGNOSIS — R413 Other amnesia: Secondary | ICD-10-CM | POA: Diagnosis not present

## 2016-06-27 DIAGNOSIS — N951 Menopausal and female climacteric states: Secondary | ICD-10-CM | POA: Diagnosis not present

## 2016-06-30 DIAGNOSIS — F5089 Other specified eating disorder: Secondary | ICD-10-CM | POA: Diagnosis not present

## 2016-07-01 MED FILL — rOPINIRole HCL 1 MG TABS: 1 | 30 days supply | Qty: 30 | Fill #1

## 2016-07-01 MED FILL — FLUoxetine HCL 40 MG CAPS: 40 | 90 days supply | Qty: 90 | Fill #0

## 2016-07-01 MED FILL — traZODone HCL 50 MG TABS: 50 | 90 days supply | Qty: 90 | Fill #0

## 2016-07-09 DIAGNOSIS — F5089 Other specified eating disorder: Secondary | ICD-10-CM | POA: Diagnosis not present

## 2016-07-14 DIAGNOSIS — F5089 Other specified eating disorder: Secondary | ICD-10-CM | POA: Diagnosis not present

## 2016-07-17 DIAGNOSIS — F5089 Other specified eating disorder: Secondary | ICD-10-CM | POA: Diagnosis not present

## 2016-07-17 MED FILL — clonazePAM 0.5 MG TABS: 0.5 | 30 days supply | Qty: 60 | Fill #0

## 2016-07-24 ENCOUNTER — Encounter: Payer: Self-pay | Admitting: Psychology

## 2016-07-29 MED FILL — rOPINIRole HCL 1 MG TABS: 1 | 30 days supply | Qty: 30 | Fill #2

## 2016-08-20 ENCOUNTER — Encounter: Payer: Self-pay | Admitting: Psychology

## 2016-08-26 MED FILL — rOPINIRole HCL 1 MG TABS: 1 | 30 days supply | Qty: 30 | Fill #0

## 2016-08-28 DIAGNOSIS — F5089 Other specified eating disorder: Secondary | ICD-10-CM | POA: Diagnosis not present

## 2016-09-03 DIAGNOSIS — F5089 Other specified eating disorder: Secondary | ICD-10-CM | POA: Diagnosis not present

## 2016-09-06 DIAGNOSIS — F4322 Adjustment disorder with anxiety: Secondary | ICD-10-CM | POA: Diagnosis not present

## 2016-09-06 DIAGNOSIS — F331 Major depressive disorder, recurrent, moderate: Secondary | ICD-10-CM | POA: Diagnosis not present

## 2016-09-06 DIAGNOSIS — F5089 Other specified eating disorder: Secondary | ICD-10-CM | POA: Diagnosis not present

## 2016-09-06 DIAGNOSIS — F3342 Major depressive disorder, recurrent, in full remission: Secondary | ICD-10-CM | POA: Diagnosis not present

## 2016-09-06 DIAGNOSIS — F900 Attention-deficit hyperactivity disorder, predominantly inattentive type: Secondary | ICD-10-CM | POA: Diagnosis not present

## 2016-09-06 MED FILL — VYVANSE 50 MG CAPSULE: 50 | 30 days supply | Qty: 30 | Fill #0

## 2016-09-11 DIAGNOSIS — F5089 Other specified eating disorder: Secondary | ICD-10-CM | POA: Diagnosis not present

## 2016-09-11 DIAGNOSIS — D485 Neoplasm of uncertain behavior of skin: Secondary | ICD-10-CM | POA: Diagnosis not present

## 2016-09-11 DIAGNOSIS — L859 Epidermal thickening, unspecified: Secondary | ICD-10-CM | POA: Diagnosis not present

## 2016-09-16 ENCOUNTER — Encounter: Payer: Self-pay | Admitting: Psychology

## 2016-09-16 ENCOUNTER — Ambulatory Visit (INDEPENDENT_AMBULATORY_CARE_PROVIDER_SITE_OTHER): Payer: 59 | Admitting: Psychology

## 2016-09-16 DIAGNOSIS — F329 Major depressive disorder, single episode, unspecified: Secondary | ICD-10-CM

## 2016-09-16 DIAGNOSIS — R413 Other amnesia: Secondary | ICD-10-CM

## 2016-09-16 NOTE — Progress Notes (Signed)
NEUROPSYCHOLOGICAL INTERVIEW (CPT: [id number])  Name: [name] Date of Birth: 1965-10-30 Date of Interview: 09/16/2016  Reason for Referral:  Sherri Perry is a 51 y.o. female who is referred for neuropsychological evaluation by Eloise Levels, NP of [facility], and Everlene Farrier, MD of [facility] due to concerns about memory loss. This patient is unaccompanied in the office for today's appointment.  History of Presenting Problem:  Sherri Perry reported gradual onset and progressive worsening of memory difficulties over the past 1 1/2 to 2 years. Symptoms are constant. She reported she is significantly bothered by word finding difficulty. When she cannot recall the word she wants to use when she is speaking in meetings, it is quite frustrating and bothersome to her. She also reported that she forgets to do things she knows she needs to do. For example, she may know she has an appointment at 2 pm that day, but at 2 pm she does not remember it. She is not having any difficulty remembering to take her medications. She is not forgetting recent conversations or events. She notes that she has always had a poor memory of remote personal history, but she was on a lot of antidepressants throughout her life and was "possibly not present all the time". She reports that she was having significant difficulty with concentration and distractibility, but her psychiatrist started her on Vyvanse last week and this has helped her attention markedly. She also reports that prior to starting Vyvanse she had been feeling exhausted daily with lack of motivation, but she has more energy now. She is having some more trouble staying asleep at night since starting the medication. She has a history of insomnia but has benefited from Trazodone. She has also been on Requip for >3 years for RLS.  The patient reported increased stress in the past year. Her job is stressful, but she also lost her mother  2 weeks before she got married in October 2017. She sees Dr. Toy Care for medication management and she sees a counselor for psychotherapy, which has been helpful.  The patient denied history of learning/attention difficulties in school as a child.   There is no known family history of dementia or memory loss. There is no known family history of ADHD.   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 She is married and lives with her husband. She manages all complex ADLs independently without any difficulty, including driving, medications, finances.   She endorsed balance difficulty. She has not fallen but is much more unsteady upon getting out of a car, for example. This was occurring several times a week but it also seems improved since starting Vyvanse although she is unsure why this would contribute.  Her appetite is reduced since starting Vyvanse but she has implemented reminders to eat.    Psychiatric History: History of depression, anxiety, other MH disorder: Depression History of MH treatment: yes, many antidepressants over the years History of SI: Denies. Also no hx of inpatient psychiatric hospitalization. History of substance dependence/treatment: Denies   Social History: Born/Raised: [place]  Education: Bachelor's degree No learning probs, performed well in school as a child and young adult. 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 No children. Alcohol: Social/occasional (1-2x/month) Tobacco: Former (quit 51yo) SA: No   Medical History: Past Medical History:  Diagnosis Date  .  Anxiety   . Depression   . GERD (gastroesophageal reflux disease)   . Hyperlipemia   . RLS (restless legs syndrome)   . Sleep apnea    mild, no CPAP needed  . Snores       Current Medications:  Outpatient Encounter Prescriptions as of  09/16/2016  Medication Sig  . atorvastatin (LIPITOR) 20 MG tablet Take 20 mg by mouth daily.  Marland Kitchen azithromycin (ZITHROMAX Z-PAK) 250 MG tablet As directed  . benzonatate (TESSALON PERLES) 100 MG capsule Take 1 capsule (100 mg total) by mouth 3 (three) times daily as needed for cough.  . Biotin 10 MG TABS Take by mouth.  Marland Kitchen buPROPion (WELLBUTRIN) 100 MG tablet Take 450 mg by mouth 2 (two) times daily.  Marland Kitchen docusate sodium (COLACE) 100 MG capsule Take 1 capsule (100 mg total) by mouth 3 (three) times daily as needed.  Marland Kitchen FLUoxetine (PROZAC) 20 MG tablet Take 20 mg by mouth daily.  . fluticasone (FLONASE) 50 MCG/ACT nasal spray Place 2 sprays into both nostrils daily.  . Iron-Vit C-Vit B12-Folic Acid (IRON 829 PLUS PO) Take by mouth.  . Multiple Vitamins-Minerals (MULTIVITAMIN WITH MINERALS) tablet Take 1 tablet by mouth daily.  . Omega-3 Fatty Acids (FISH OIL) 1000 MG CAPS Take by mouth.  . oxyCODONE-acetaminophen (ROXICET) 5-325 MG tablet Take 1-2 tablets by mouth every 4 (four) hours as needed for severe pain.  Marland Kitchen rOPINIRole (REQUIP) 2 MG tablet Take 2 mg by mouth at bedtime.  . traZODone (DESYREL) 100 MG tablet Take 100 mg by mouth at bedtime.  . Turmeric 500 MG CAPS Take by mouth.   No facility-administered encounter medications on file as of 09/16/2016.      Behavioral Observations:   Appearance: Neatly, casually and appropriately dressed and groomed Gait: Ambulated independently, no gross abnormalities observed Speech: Fluent; normal rate, rhythm and volume. Mild word finding difficulty. Thought process: Linear, goal directed Affect: Full, anxious Interpersonal: Pleasant, appropriate   TESTING: There is medical necessity to proceed with neuropsychological assessment as the results will be used to aid in differential diagnosis and clinical decision-making and to inform specific treatment recommendations. Per the patient and medical records reviewed, there has been a change in cognitive  functioning and a reasonable suspicion of neurocognitive disorder. There is a need for objective testing of the patient's subjective cognitive complaints in order to determine neurologic versus psychiatric etiology (eg anxiety).   PLAN: I spoke with the patient about possible non-neurologic causes of word finding difficulty (eg, anxiety) and explained how this can happen. She was open to this and hopeful. The patient will return for a full battery of neuropsychological testing with a psychometrician under my supervision. Education regarding testing procedures was provided. She will NOT take Vyvanse the day of her evaluation. Subsequently, the patient will see this provider for a follow-up session at which time her test performances and my impressions and treatment recommendations will be reviewed in detail.  Full neuropsychological evaluation report to follow.

## 2016-09-19 MED FILL — rOPINIRole HCL 1 MG TABS: 1 | 30 days supply | Qty: 30 | Fill #1

## 2016-09-20 DIAGNOSIS — F5089 Other specified eating disorder: Secondary | ICD-10-CM | POA: Diagnosis not present

## 2016-09-20 MED FILL — BUPROPION HCL XL 150 MG TAB: 150 | 30 days supply | Qty: 90 | Fill #0

## 2016-09-25 ENCOUNTER — Ambulatory Visit (INDEPENDENT_AMBULATORY_CARE_PROVIDER_SITE_OTHER): Payer: 59 | Admitting: Psychology

## 2016-09-25 DIAGNOSIS — R413 Other amnesia: Secondary | ICD-10-CM | POA: Diagnosis not present

## 2016-09-26 NOTE — Progress Notes (Signed)
   Neuropsychology Note  Sherri Perry returned today for 2 hours of neuropsychological testing with technician, Milana Kidney, BS, under the supervision of Dr. Macarthur Critchley. The patient did not appear overtly distressed by the testing session, per behavioral observation or via self-report to the technician. Rest breaks were offered. Sherri Perry will return within 2 weeks for a feedback session with Dr. Si Raider at which time her test performances, clinical impressions and treatment recommendations will be reviewed in detail. The patient understands she can contact our office should she require our assistance before this time.  Full report to follow.

## 2016-10-03 NOTE — Progress Notes (Deleted)
NEUROPSYCHOLOGICAL EVALUATION   Name:    Sherri Perry  Date of Birth:   1965-10-02 Date of Interview:  09/16/2016 Date of Testing:  09/25/2016   Date of Feedback:  10/07/2016       Background Information:  Reason for Referral:  Sherri Perry is a 51 y.o. female referred by Eloise Levels, NP, of [facility] and Everlene Farrier, MD, of [facility] to assess her current level of cognitive functioning and assist in differential diagnosis. The current evaluation consisted of a review of available medical records, an interview with the patient, and the completion of a neuropsychological testing battery. Informed consent was obtained.  History of Presenting Problem:  Sherri Perry reported gradual onset and progressive worsening of memory difficulties over the past 1 1/2 to 2 years. Symptoms are constant. She reported she is significantly bothered by word finding difficulty. When she cannot recall the word she wants to use when she is speaking in meetings, it is quite frustrating and bothersome to her. She also reported that she forgets to do things she knows she needs to do. For example, she may know she has an appointment at 2 pm that day, but at 2 pm she does not remember it. She is not having any difficulty remembering to take her medications. She is not forgetting recent conversations or events. She notes that she has always had a poor memory of remote personal history, but she was on a lot of antidepressants throughout her life and was "possibly not present all the time". She reports that she was having significant difficulty with concentration and distractibility, but her psychiatrist started her on Vyvanse last week and this has helped her attention markedly. She also reports that prior to starting Vyvanse she had been feeling exhausted daily with lack of motivation, but she has more energy now. She is having some more trouble staying asleep at night since starting the  medication. She has a history of insomnia but has benefited from Trazodone. She has also been on Requip for >3 years for RLS.  The patient reported increased stress in the past year. Her job is stressful, but she also lost her mother 2 weeks before she got married in October 2017. She sees Dr. Toy Care for medication management and she sees a counselor for psychotherapy, which has been helpful.  The patient denied history of learning/attention difficulties in school as a child.   There is no known family history of dementia or memory loss. There is no known family history of ADHD.   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 She is married and lives with her husband. She manages all complex ADLs independently without any difficulty, including driving, medications, finances.   She endorsed balance difficulty. She has not fallen but is much more unsteady upon getting out of a car, for example. This was occurring several times a week but it also seems improved since starting Vyvanse although she is unsure why this would contribute.  Her appetite is reduced since starting Vyvanse but she has implemented reminders to eat.    Psychiatric History: History of depression, anxiety, other MH disorder: Depression History of MH treatment: yes, many antidepressants over the years History of SI: Denies. Also no hx of inpatient psychiatric hospitalization. History of substance dependence/treatment: Denies   Social History: Born/Raised: [place]  Education: Bachelor's degree No learning probs, performed well in school as a child and young adult. 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 No children. Alcohol: Social/occasional (1-2x/month) Tobacco: Former (quit 51yo) SA: No   Medical History:  Past Medical History:  Diagnosis Date  .  Anxiety   . Depression   . GERD (gastroesophageal reflux disease)   . Hyperlipemia   . RLS (restless legs syndrome)   . Sleep apnea    mild, no CPAP needed  . Snores     Current medications:  Outpatient Encounter Prescriptions as of 10/07/2016  Medication Sig  . atorvastatin (LIPITOR) 20 MG tablet Take 20 mg by mouth daily.  Marland Kitchen azithromycin (ZITHROMAX Z-PAK) 250 MG tablet As directed (Patient not taking: Reported on 09/16/2016)  . benzonatate (TESSALON PERLES) 100 MG capsule Take 1 capsule (100 mg total) by mouth 3 (three) times daily as needed for cough. (Patient not taking: Reported on 09/16/2016)  . Biotin 10 MG TABS Take by mouth.  Marland Kitchen buPROPion (WELLBUTRIN) 100 MG tablet Take 450 mg by mouth 2 (two) times daily.  . clonazePAM (KLONOPIN) 0.5 MG tablet Take 0.5 mg by mouth daily.  Marland Kitchen docusate sodium (COLACE) 100 MG capsule Take 1 capsule (100 mg total) by mouth 3 (three) times daily as needed. (Patient not taking: Reported on 09/16/2016)  . FLUoxetine (PROZAC) 20 MG tablet Take 20 mg by mouth daily.  Marland Kitchen FLUoxetine (PROZAC) 40 MG capsule Take 40 mg by mouth daily.  . fluticasone (FLONASE) 50 MCG/ACT nasal spray Place 2 sprays into both nostrils daily. (Patient not taking: Reported on 09/16/2016)  . Iron-Vit C-Vit B12-Folic Acid (IRON 144 PLUS PO) Take by mouth.  . Multiple Vitamins-Minerals (MULTIVITAMIN WITH MINERALS) tablet Take 1 tablet by mouth daily.  . Omega-3 Fatty Acids (FISH OIL) 1000 MG CAPS Take by mouth.  . oxyCODONE-acetaminophen (ROXICET) 5-325 MG tablet Take 1-2 tablets by mouth every 4 (four) hours as needed for severe pain. (Patient not taking: Reported on 09/16/2016)  . rOPINIRole (REQUIP) 1 MG tablet Take 1 mg by mouth at bedtime.  Marland Kitchen rOPINIRole (REQUIP) 2 MG tablet Take 2 mg by mouth at bedtime.  . traZODone (DESYREL) 100 MG tablet Take 100 mg by mouth at bedtime.  . traZODone (DESYREL) 50 MG tablet Take 50 mg by mouth at bedtime.  . Turmeric 500 MG CAPS Take by mouth.  Marland Kitchen  VYVANSE 50 MG capsule Take 50 mg by mouth every morning.   No facility-administered encounter medications on file as of 10/07/2016.      Current Examination:  Behavioral Observations:   Appearance: Neatly, casually and appropriately dressed and groomed Gait: Ambulated independently, no gross abnormalities observed Speech: Fluent; normal rate, rhythm and volume. Mild word finding difficulty. Thought process: Linear, goal directed Affect: Full, anxious Interpersonal: Pleasant, appropriate Orientation: Oriented to person, place and most aspects of time (off on the date by one day). Accurately named the current President and his predecessor.  Please note patient did not take Vyvanse the day of the cognitive evaluation, per my request.  Tests Administered: . Test of Premorbid Functioning (TOPF) . Wechsler Adult Intelligence Scale-Fourth Edition (WAIS-IV): Similarities, Arithmetic, Symbol Search, Coding and Digit Span subtests . Wechsler Memory Scale-Fourth Edition (WMS-IV) Adult Version (ages 27-69): Logical Memory I, II and Recognition subtests  . Wisconsin Verbal Learning Test - 2nd Edition (CVLT-2) Standard Form . Conners Continuous Performance Test 3rd Edition (CPT3) . [employer] (WCST) . Repeatable Battery for the Assessment of Neuropsychological Status (  RBANS) Form A:  Figure Copy and Figure Recall subtest and Semantic Fluency subtest . Neuropsychological Assessment Battery (NAB) Language Module, Form 1:  Naming subtest . Controlled Oral Word Association Test (COWAT) . Trail Making Test A and B . Beck Depression Inventory - Second edition (BDI-II) . Generalized Anxiety Disorder - 7 item screener (GAD-7)  Test Results: Note: Standardized scores are presented only for use by appropriately trained professionals and to allow for any future test-retest comparison. These scores should not be interpreted without consideration of all the information that is contained in the  rest of the report. The most recent standardization samples from the test publisher or other sources were used whenever possible to derive standard scores; scores were corrected for age, gender, ethnicity and education when available.   Test Scores:  ***  Description of Test Results:  Embedded performance validity indicators were within normal limits. As such, the patient's current performance on neurocognitive testing is judged to be a relatively accurate representation of her current level of neurocognitive functioning when NOT medicated with a stimulant.   Premorbid verbal intellectual abilities were estimated to have been within the high average range based on a test of word reading. Psychomotor processing speed was high average. Auditory attention and working memory were average. On a test of sustained visual attention highly sensitive to ADHD, the patient made more commission errors, responded faster and displayed more of a reduction in response speed at longer interstimulus intervals compared to the normative sample. Her profile of scores and response patterns on this test indicated issues related to vigilance and impulsivity. Visual-spatial construction was superior. Language abilities were within normal limits but slightly below expectation. Specifically, confrontation naming and semantic verbal fluency were low average. With regard to verbal memory, encoding and acquisition of non-contextual information (i.e., word list) was superior. After a brief interference task, free recall was very superior (16/16 items recalled). After a delay, free recall was superior with 100% retention (16/16 items recalled). Performance on a yes/no recognition task was high average. On another verbal memory test, encoding and acquisition of contextual auditory information (i.e., short stories) was average. After a delay, free recall was high average. Performance on a yes/no recognition task was above average. With  regard to non-verbal memory, delayed free recall of visual information was average. Executive functioning was intact overall. Mental flexibility and set-shifting were high average on Trails B. Verbal fluency with phonemic search restrictions was average. Verbal abstract reasoning was average. Deductive reasoning and problem solving was intact.   On a self-report measure of mood (BDI-II), the patient's responses were indicative of clinically significant depression at the present time. Symptoms endorsed included: mild sadness, tearfulness, anhedonia, reduced energy, reduced sleep, irritability. She reported higher levels of self-dislike, self-criticalness, reduced appetite (due to medication), and lack of libido (due to medication). She denied suicidal ideation or intention. On a self-report measure of anxiety, the patient endorsed clinically significant generalized anxiety at the present time characterized by excessive worry, irritability, nervousness, inability to control worrying, and restlessness.   Clinical Impressions: Probable ADHD. Chronic depression/anxiety. Results of cognitive testing were consistent with what is often seen in attention deficit disorder, but she has no childhood history of attention deficits which is currently required for formal diagnosis. There is some new research in the field suggesting that ADHD may not be an exclusively childhood neurodevelopmental disorder as has been postulated for decades and instead could appear later in life. Of course, this patient's attention deficits could be secondary to  chronic depression/anxiety, but Vyvanse seems to be helping with attention/concentration and even some symptoms of depression, again suggesting underlying ADD. There was no sign of any other cognitive impairment on formal testing, and in fact she demonstrated many strengths including very superior memory function. Psychological screening measures suggested ongoing mild depression and  anxiety.    Recommendations/Plan: Based on the findings of the present evaluation, the following recommendations are offered:  1. Continue mental health treatment (medication and counseling).  2. These test results will provide a nice baseline for future comparison if ever needed.   Feedback to Patient: RAMINA HULET returned for a feedback appointment on 10/07/2016 to review the results of her neuropsychological evaluation with this provider. *** minutes face-to-face time was spent reviewing her test results, my impressions and my recommendations as detailed above.    Total time spent on this patient's case: [id number] unit for interview with psychologist; [phone] units of testing by psychometrician under psychologist's supervision; [phone] units for medical record review, scoring of neuropsychological tests, interpretation of test results, preparation of this report, and review of results to the patient by psychologist.      Thank you for your referral of Sherri Perry. Please feel free to contact me if you have any questions or concerns regarding this report.

## 2016-10-04 DIAGNOSIS — F5089 Other specified eating disorder: Secondary | ICD-10-CM | POA: Diagnosis not present

## 2016-10-07 ENCOUNTER — Encounter: Payer: Self-pay | Admitting: Psychology

## 2016-10-07 MED FILL — VYVANSE 50 MG CAPSULE: 50 | 30 days supply | Qty: 30 | Fill #0

## 2016-10-08 ENCOUNTER — Ambulatory Visit (INDEPENDENT_AMBULATORY_CARE_PROVIDER_SITE_OTHER): Payer: 59 | Admitting: Psychology

## 2016-10-08 ENCOUNTER — Encounter: Payer: Self-pay | Admitting: Psychology

## 2016-10-08 DIAGNOSIS — F909 Attention-deficit hyperactivity disorder, unspecified type: Secondary | ICD-10-CM

## 2016-10-08 DIAGNOSIS — R413 Other amnesia: Secondary | ICD-10-CM

## 2016-10-08 NOTE — Progress Notes (Signed)
NEUROPSYCHOLOGICAL EVALUATION    Name:                                      Sherri Perry     Date of Birth:                           01/24/1966 Date of Interview:                    09/16/2016 Date of Testing:                      09/25/2016                     Date of Feedback:                  10/08/2016                                     Background Information:  Reason for Referral:  Sherri Perry is a 51 y.o. female referred by Eloise Levels, NP, of [facility] and Everlene Farrier, MD, of [facility] to assess her current level of cognitive functioning and assist in differential diagnosis. The current evaluation consisted of a review of available medical records, an interview with the patient, and the completion of a neuropsychological testing battery. Informed consent was obtained.  History of Presenting Problem:  Sherri Perry reported gradual onset and progressive worsening of memory difficulties over the past 1 1/2 to 2 years. Symptoms are constant. She reported she is significantly bothered by word finding difficulty. When she cannot recall the word she wants to use when she is speaking in meetings, it is quite frustrating and bothersome to her. She also reported that she forgets to do things she knows she needs to do. For example, she may know she has an appointment at 2 pm that day, but at 2 pm she does not remember it. She is not having any difficulty remembering to take her medications. She is not forgetting recent conversations or events. She notes that she has always had a poor memory of remote personal history, but she was on a lot of antidepressants throughout her life and was "possibly not present all the time". She reports that she was having significant difficulty with concentration and distractibility, but her psychiatrist started her on Vyvanse last week and this has helped her attention markedly. She also reports that prior to starting  Vyvanse she had been feeling exhausted daily with lack of motivation, but she has more energy now. She is having some more trouble staying asleep at night since starting the medication. She has a history of insomnia but has benefited from Trazodone. She has also been on Requip for >3 years for RLS.  The patient reported increased stress in the past year. Her job is stressful, but she also lost her mother 2 weeks before she got married in October 2017. She sees Dr. Toy Care for medication management and she sees a counselor for psychotherapy, which has been helpful.  The patient denied history of learning/attention difficulties in school as a child.   There is no known family history of dementia or memory loss. There is no known family history of  ADHD.   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 She is married and lives with her husband. She manages all complex ADLs independently without any difficulty, including driving, medications, finances.   She endorsed balance difficulty. She has not fallen but is much more unsteady upon getting out of a car, for example. This was occurring several times a week but it also seems improved since starting Vyvanse although she is unsure why this would contribute.  Her appetite is reduced since starting Vyvanse but she has implemented reminders to eat.    Psychiatric History: History of depression, anxiety, other MH disorder: Depression History of MH treatment: yes, many antidepressants over the years History of SI: Denies. Also no hx of inpatient psychiatric hospitalization. History of substance dependence/treatment: Denies   Social History: Born/Raised: [place]  Education: Bachelor's degree No learning probs, performed well in school as a child and young adult. Occupational history: Human resources x15 years. 3 years in current position (moved to [place] from [facility] 3 years ago) Marital history: Married (for less than a year - never married  before this). No children. Alcohol: Social/occasional (1-2x/month) Tobacco: Former (quit 51yo) SA: No   Medical History:      Past Medical History:  Diagnosis Date  . Anxiety   . Depression   . GERD (gastroesophageal reflux disease)   . Hyperlipemia   . RLS (restless legs syndrome)   . Sleep apnea    mild, no CPAP needed  . Snores     Current medications:      Outpatient Encounter Prescriptions as of 10/07/2016  Medication Sig  . atorvastatin (LIPITOR) 20 MG tablet Take 20 mg by mouth daily.  Marland Kitchen azithromycin (ZITHROMAX Z-PAK) 250 MG tablet As directed (Patient not taking: Reported on 09/16/2016)  . benzonatate (TESSALON PERLES) 100 MG capsule Take 1 capsule (100 mg total) by mouth 3 (three) times daily as needed for cough. (Patient not taking: Reported on 09/16/2016)  . Biotin 10 MG TABS Take by mouth.  Marland Kitchen buPROPion (WELLBUTRIN) 100 MG tablet Take 450 mg by mouth 2 (two) times daily.  . clonazePAM (KLONOPIN) 0.5 MG tablet Take 0.5 mg by mouth daily.  Marland Kitchen docusate sodium (COLACE) 100 MG capsule Take 1 capsule (100 mg total) by mouth 3 (three) times daily as needed. (Patient not taking: Reported on 09/16/2016)  . FLUoxetine (PROZAC) 20 MG tablet Take 20 mg by mouth daily.  Marland Kitchen FLUoxetine (PROZAC) 40 MG capsule Take 40 mg by mouth daily.  . fluticasone (FLONASE) 50 MCG/ACT nasal spray Place 2 sprays into both nostrils daily. (Patient not taking: Reported on 09/16/2016)  . Iron-Vit C-Vit B12-Folic Acid (IRON 921 PLUS PO) Take by mouth.  . Multiple Vitamins-Minerals (MULTIVITAMIN WITH MINERALS) tablet Take 1 tablet by mouth daily.  . Omega-3 Fatty Acids (FISH OIL) 1000 MG CAPS Take by mouth.  . oxyCODONE-acetaminophen (ROXICET) 5-325 MG tablet Take 1-2 tablets by mouth every 4 (four) hours as needed for severe pain. (Patient not taking: Reported on 09/16/2016)  . rOPINIRole (REQUIP) 1 MG tablet Take 1 mg by mouth at bedtime.  Marland Kitchen rOPINIRole (REQUIP) 2 MG tablet Take 2 mg by mouth at  bedtime.  . traZODone (DESYREL) 100 MG tablet Take 100 mg by mouth at bedtime.  . traZODone (DESYREL) 50 MG tablet Take 50 mg by mouth at bedtime.  . Turmeric 500 MG CAPS Take by mouth.  Marland Kitchen VYVANSE 50 MG capsule Take 50 mg by mouth every morning.   No facility-administered encounter medications on file  as of 10/07/2016.      Current Examination:  Behavioral Observations:  Appearance: Neatly, casually and appropriately dressedand groomed Gait: Ambulated independently,no gross abnormalities observed Speech: Fluent; normal rate, rhythm and volume. Mildword finding difficulty. Thought process: Linear, goal directed Affect: Full, anxious Interpersonal: Pleasant, appropriate Orientation: Oriented to person, place and most aspects of time (off on the date by one day). Accurately named the current President and his predecessor.  Please note patient did not take Vyvanse the day of the cognitive evaluation, per my request.  Tests Administered:  Test of Premorbid Functioning (TOPF)  Wechsler Adult Intelligence Scale-Fourth Edition (WAIS-IV): Similarities, Arithmetic, Symbol Search, Coding and Digit Span subtests  Wechsler Memory Scale-Fourth Edition (WMS-IV) Adult Version (ages 84-69): Logical Memory I, II and Recognition subtests   Wisconsin Verbal Learning Test - 2nd Edition (CVLT-2) Standard Form  Conners Continuous Performance Test 3rd Edition (CPT3)  [place] Systems developer (WCST)  Repeatable Battery for the Assessment of Neuropsychological Status (RBANS) Form A:  Figure Copy and Figure Recall subtest and Semantic Fluency subtest  Neuropsychological Assessment Battery (NAB) Language Module, Form 1:  Naming subtest  Controlled Oral Word Association Test (COWAT)  Trail Making Test A and B  Beck Depression Inventory - Second edition (BDI-II)  Generalized Anxiety Disorder - 7 item screener (GAD-7)  Test Results: Note: Standardized scores are presented only for use  by appropriately trained professionals and to allow for any future test-retest comparison. These scores should not be interpreted without consideration of all the information that is contained in the rest of the report. The most recent standardization samples from the test publisher or other sources were used whenever possible to derive standard scores; scores were corrected for age, gender, ethnicity and education when available.  Test Scores:  Test Name Raw Score Standardized Score Descriptor  TOPF 61/70 SS= 118 High average  WAIS-IV Subtests     Similarities 27/36 ss= 10 Average  Arithmetic 13/22 ss= 9 Average  Symbol Search 37/60 ss= 12 High average  Coding 75/135 ss= 12 High average  Digit Span 32/48 ss= 12 High average  WAIS-IV Index Scores     Working Memory  SS= 102 Average  Processing Speed  SS= 111 High average  WMS-IV Subtests     LM I 28/50 ss= 11 Average  LM II 26/50 ss= 12 High average  LM II Recognition 29/30 Cum %: >75 Above average  CVLT-II Scores     Trial 1 9/16 Z= 1.5 Superior  Trial 5 16/16 Z= 1.5 Superior  Trials 1-5 total 62/80 T= 65 Superior  SD Free Recall 16/16 Z= 2 Very superior  SD Cued Recall 16/16 Z= 1.5 Superior  LD Free Recall 16/16 Z= 1.5 Superior  LD Cued Recall 16/16 Z= 1.5 Superior  Recognition Discriminability 16/16; 1 false positive Z= 1 High average  Forced Choice Recognition 16/16  WNL  CPT3     Detectability  T= 58 High average  Omissions  T= 48 Average  Commissions  T= 61 Elevated  Perseverations  T= 46 Average  HRT ISI Change  T= 62 Elevated  WCST     Total Errors 11 T= 54 Average  Perseverative Responses 5 T= 55 Average  Perseverative Errors 5 T= 49 Average  Conceptual Level Responses 50 T= 49 Average  Categories Completed 4 >16% WNL  Trials to Complete 1st Category 12 >16% WNL  Failure to Maintain Set 0  WNL  RBANS Subtests     Figure Copy 20/20 Z= 1.3 Superior  Figure Recall 12/20 Z= -0.5 Average  Semantic Fluency 17/40 Z=  -0.8 Low average  NAB Language Naming 29/31 T= 39 Low average  COWAT-FAS 41 T= 47 Average  COWAT-Animals 17 T= 41 Low average  Trail Making Test A  29" 1 error T= 56 Average  Trail Making Test B  62" 1 error T= 57 High average  BDI-II 18/63  Mild  GAD-7 9/21  Mild     Description of Test Results:  Embedded performance validity indicators were within normal limits. As such, the patient's current performance on neurocognitive testing is judged to be a relatively accurate representation of her current level of neurocognitive functioning when NOT medicated with a stimulant.   Premorbid verbal intellectual abilities were estimated to have been within the high average range based on a test of word reading. Psychomotor processing speed was high average. Auditory attention and working memory were average. On a test of sustained visual attention highly sensitive to ADHD, the patient made more commission errors, responded faster and displayed more of a reduction in response speed at longer interstimulus intervals compared to the normative sample. Her profile of scores and response patterns on this test indicated issues related to vigilance and impulsivity. Visual-spatial construction was superior. Language abilities were within normal limits but slightly below expectation. Specifically, confrontation naming and semantic verbal fluency were low average. With regard to verbal memory, encoding and acquisition of non-contextual information (i.e., word list) was superior. After a brief interference task, free recall was very superior (16/16 items recalled). After a delay, free recall was superior with 100% retention (16/16 items recalled). Performance on a yes/no recognition task was high average. On another verbal memory test, encoding and acquisition of contextual auditory information (i.e., short stories) was average. After a delay, free recall was high average. Performance on a yes/no recognition task was  above average. With regard to non-verbal memory, delayed free recall of visual information was average. Executive functioning was intact overall. Mental flexibility and set-shifting were high average on Trails B. Verbal fluency with phonemic search restrictions was average. Verbal abstract reasoning was average. Deductive reasoning and problem solving was intact.   On a self-report measure of mood (BDI-II), the patient's responses were indicative of clinically significant depression at the present time. Symptoms endorsed included: mild sadness, tearfulness, anhedonia, reduced energy, reduced sleep, irritability. She reported higher levels of self-dislike, self-criticalness, reduced appetite (due to medication), and lack of libido (due to medication). She denied suicidal ideation or intention. On a self-report measure of anxiety, the patient endorsed clinically significant generalized anxiety at the present time characterized by excessive worry, irritability, nervousness, inability to control worrying, and restlessness.   Clinical Impressions: Probable ADHD. Chronic depression/anxiety. Results of cognitive testing were consistent with what is often seen in attention deficit disorder, but she has no childhood history of attention deficits which is currently required for formal diagnosis. There is some new research in the field suggesting that ADHD may not be an exclusively childhood neurodevelopmental disorder as has been postulated for decades and instead could appear later in life. Of course, this patient's attention deficits could be secondary to chronic depression/anxiety, but Vyvanse seems to be helping with attention/concentration and even some symptoms of depression, again suggesting underlying ADD. There was no sign of any other cognitive impairment on formal testing, and in fact she demonstrated many strengths including very superior memory function. Psychological screening measures suggested  ongoing mild depression and anxiety.    Recommendations/Plan: Based on the findings of the  present evaluation, the following recommendations are offered:  1. Continue mental health treatment (medication and counseling).  2. These test results will provide a nice baseline for future comparison if ever needed.   Feedback to Patient: ANALINA FILLA completed a feedback appointment on 10/08/2016 to review the results of her neuropsychological evaluation with this provider. 15 minutes was spent reviewing her test results, my impressions and my recommendations as detailed above.    Total time spent on this patient's case: 90791x1 unit for interview with psychologist; [phone] units of testing by psychometrician under psychologist's supervision; [phone] units for medical record review, scoring of neuropsychological tests, interpretation of test results, preparation of this report, and review of results to the patient by psychologist.    Thank you for your referral of Sherri Perry. Please feel free to contact me if you have any questions or concerns regarding this report.

## 2016-10-11 DIAGNOSIS — F5089 Other specified eating disorder: Secondary | ICD-10-CM | POA: Diagnosis not present

## 2016-10-11 MED FILL — FLUoxetine HCL 40 MG CAPS: 40 | 90 days supply | Qty: 90 | Fill #1

## 2016-10-11 MED FILL — traZODone HCL 50 MG TABS: 50 | 90 days supply | Qty: 90 | Fill #1

## 2016-10-16 DIAGNOSIS — F5089 Other specified eating disorder: Secondary | ICD-10-CM | POA: Diagnosis not present

## 2016-10-18 DIAGNOSIS — F5089 Other specified eating disorder: Secondary | ICD-10-CM | POA: Diagnosis not present

## 2016-10-23 DIAGNOSIS — F5089 Other specified eating disorder: Secondary | ICD-10-CM | POA: Diagnosis not present

## 2016-10-30 DIAGNOSIS — F5089 Other specified eating disorder: Secondary | ICD-10-CM | POA: Diagnosis not present

## 2016-10-30 MED FILL — BUPROPION HCL XL 150 MG TAB: 150 | 30 days supply | Qty: 90 | Fill #1

## 2016-10-30 MED FILL — rOPINIRole HCL 1 MG TABS: 1 | 30 days supply | Qty: 30 | Fill #2

## 2016-11-06 DIAGNOSIS — F5089 Other specified eating disorder: Secondary | ICD-10-CM | POA: Diagnosis not present

## 2016-11-15 ENCOUNTER — Ambulatory Visit (INDEPENDENT_AMBULATORY_CARE_PROVIDER_SITE_OTHER): Payer: 59 | Admitting: Podiatry

## 2016-11-15 ENCOUNTER — Encounter: Payer: Self-pay | Admitting: Podiatry

## 2016-11-15 ENCOUNTER — Ambulatory Visit (INDEPENDENT_AMBULATORY_CARE_PROVIDER_SITE_OTHER): Payer: 59

## 2016-11-15 VITALS — BP 120/80 | HR 91

## 2016-11-15 DIAGNOSIS — M21619 Bunion of unspecified foot: Secondary | ICD-10-CM

## 2016-11-15 DIAGNOSIS — F5089 Other specified eating disorder: Secondary | ICD-10-CM | POA: Diagnosis not present

## 2016-11-15 MED FILL — VYVANSE 50 MG CAPSULE: 50 | 30 days supply | Qty: 30 | Fill #0

## 2016-11-15 NOTE — Progress Notes (Signed)
Subjective:    Patient ID: Sherri Perry, female    DOB: 1966-01-10, 51 y.o.   MRN: [id number]  HPI  Chief Complaint  Patient presents with  . Bunions    Right foot, painful.   . Toe Pain    Left; Great toe x 2 years.    Ms. Sherri Perry the office in concerns of a painful bunion to the right foot which been ongoing for quite sometime his been getting worse. She is tried offloading padding as well shoe gear changes without any improvement. She is also tried to wear shoes have more arch support. She states that despite this she continues to get pain to the area on a daily basis. She denies numbness or tingling to the right foot. She states that on the left big toe she previously had broken her toe and she had a surgery to remove some bone and she also had a bunion surgery when she was a teenager. She states the bunions started to come back but is not painful. She also notices a bump on the top of her left big toe on the air the previous surgery which is occasionally uncomfortable with certain shoes. Padding does seem to help this. She has no other concerns.   Review of Systems  HENT: Positive for ear pain.   Hematological: Bruises/bleeds easily.  All other systems reviewed and are negative.      Objective:   Physical Exam General: AAO x3, NAD  Dermatological: Skin is warm, dry and supple bilateral. Nails x 10 are well manicured; remaining integument appears unremarkable at this time. There are no open sores, no preulcerative lesions, no rash or signs of infection present.  Vascular: Dorsalis Pedis artery and Posterior Tibial artery pedal pulses are 2/4 bilateral with immedate capillary fill time. There is no pain with calf compression, swelling, warmth, erythema.   Neruologic: Grossly intact via light touch bilateral. Protective threshold with Semmes Wienstein monofilament intact to all pedal sites bilateral.   Musculoskeletal: Moderate HAV is present in the right foot  nevus mild tenderness to the patient on a prominent bunion on the right foot. Mild erythema due to shoe gear irritation. There is no pain or crepitation first MTPJ range of motion. There is no first ray hypermobility present. On the left foot there is mild HAV reoccurrence and there is decreased range of motion of the hallux IPJ. She does have some decreased sensation to the left big toe since her surgery and this is unchanged.  Muscular strength 5/5 in all groups tested bilateral.  Gait: Unassisted, Nonantalgic.      Assessment & Plan:  51 year old female with right symptomatic HAV, mild arthritis left hallux IPJ -Treatment options discussed including all alternatives, risks, and complications -Etiology of symptoms were discussed -X-rays were obtained and reviewed with the patient. Moderate HAV is present on the right foot. There is no evidence of acute fracture bilaterally. Status post previous bunionectomy in the left foot. There does appear to be some chronic change the proximal phalanx of the IPJ and the left hallux with prior injury. No evidence of acute injury. -Discussed both conservative and surgical treatments for her right foot today. This point she is attended shoe gear modifications, offloading padding without any significant improvement as well as trying shoes with good arch support. At this point she is interested in doing surgical intervention. I discussed with an Austin bunionectomy with screw fixation. She wishes to go ahead and proceed with surgery. -The incision placement  as well as the postoperative course was discussed with the patient. I discussed risks of the surgery which include, but not limited to, infection, bleeding, pain, swelling, need for further surgery, delayed or nonhealing, painful or ugly scar, numbness or sensation changes, over/under correction, recurrence, transfer lesions, further deformity, hardware failure, DVT/PE, loss of toe/foot. Patient understands these  risks and wishes to proceed with surgery. The surgical consent was reviewed with the patient all 3 pages were signed. No promises or guarantees were given to the outcome of the procedure. All questions were answered to the best of my ability. Before the surgery the patient was encouraged to call the office if there is any further questions. The surgery will be performed at the [facility] on an outpatient basis. -Regards the left foot her symptoms are mild and controlled with shoe gear changes and offloading. Recommended continue with this. -Candidate was dispensed to wear postop.   Celesta Gentile, DPM

## 2016-11-15 NOTE — Patient Instructions (Signed)
Pre-Operative Instructions  Congratulations, you have decided to take an important step to improving your quality of life.  You can be assured that the doctors of [facility] will be with you every step of the way.  1. Plan to be at the surgery center/hospital at least 1 (one) hour prior to your scheduled time unless otherwise directed by the surgical center/hospital staff.  You must have a responsible adult accompany you, remain during the surgery and drive you home.  Make sure you have directions to the surgical center/hospital and know how to get there on time. 2. For hospital based surgery you will need to obtain a history and physical form from your family physician within 1 month prior to the date of surgery- we will give you a form for you primary physician.  3. We make every effort to accommodate the date you request for surgery.  There are however, times where surgery dates or times have to be moved.  We will contact you as soon as possible if a change in schedule is required.   4. No Aspirin/Ibuprofen for one week before surgery.  If you are on aspirin, any non-steroidal anti-inflammatory medications (Mobic, Aleve, Ibuprofen) you should stop taking it 7 days prior to your surgery.  You make take Tylenol  For pain prior to surgery.  5. Medications- If you are taking daily heart and blood pressure medications, seizure, reflux, allergy, asthma, anxiety, pain or diabetes medications, make sure the surgery center/hospital is aware before the day of surgery so they may notify you which medications to take or avoid the day of surgery. 6. No food or drink after midnight the night before surgery unless directed otherwise by surgical center/hospital staff. 7. No alcoholic beverages 24 hours prior to surgery.  No smoking 24 hours prior to or 24 hours after surgery. 8. Wear loose pants or shorts- loose enough to fit over bandages, boots, and casts. 9. No slip on shoes, sneakers are best. 10. Bring  your boot with you to the surgery center/hospital.  Also bring crutches or a walker if your physician has prescribed it for you.  If you do not have this equipment, it will be provided for you after surgery. 11. If you have not been contracted by the surgery center/hospital by the day before your surgery, call to confirm the date and time of your surgery. 12. Leave-time from work may vary depending on the type of surgery you have.  Appropriate arrangements should be made prior to surgery with your employer. 13. Prescriptions will be provided immediately following surgery by your doctor.  Have these filled as soon as possible after surgery and take the medication as directed. 14. Remove nail polish on the operative foot. 15. Wash the night before surgery.  The night before surgery wash the foot and leg well with the antibacterial soap provided and water paying special attention to beneath the toenails and in between the toes.  Rinse thoroughly with water and dry well with a towel.  Perform this wash unless told not to do so by your physician.  Enclosed: 1 Ice pack (please put in freezer the night before surgery)   1 Hibiclens skin cleaner   Pre-op Instructions  If you have any questions regarding the instructions, do not hesitate to call our office at any point during this process.   [place]: [place], [place] [address] [place]: [address] [place], [place] [address] [phone]  Dr. Celesta Gentile, DPM  Bunion A bunion is a bump on the base of the big toe that forms when the bones of the big toe joint move out of position. Bunions may be small at first, but they often get larger over time. The can make walking painful. What are the causes? A bunion may be caused by:  Wearing narrow or pointed shoes that force the big toe to press against the other toes.  Abnormal foot development that causes the foot to roll inward (pronate).  Changes in the foot that  are caused by certain diseases, such as rheumatoid arthritis and polio.  A foot injury.  What increases the risk? The following factors may make you more likely to develop this condition:  Wearing shoes that squeeze the toes together.  Having certain diseases, such as: ? Rheumatoid arthritis. ? Polio. ? Cerebral palsy.  Having family members who have bunions.  Being born with a foot deformity, such as flat feet or low arches.  Doing activities that put a lot of pressure on the feet, such as ballet dancing.  What are the signs or symptoms? The main symptom of a bunion is a noticeable bump on the big toe. Other symptoms may include:  Pain.  Swelling around the big toe.  Redness and inflammation.  Thick or hardened skin on the big toe or between the toes.  Stiffness or loss of motion in the big toe.  Trouble with walking.  How is this diagnosed? A bunion may be diagnosed based on your symptoms, medical history, and activities. You may have tests, such as:  X-rays. These allow your health care provider to check the position of the bones in your foot and look for damage to your joint. They also help your health care provider to determine the severity of your bunion and the best way to treat it.  Joint aspiration. In this test, a sample of fluid is removed from the toe joint. This test, which may be done if you are in a lot of pain, helps to rule out diseases that cause painful swelling of the joints, such as arthritis.  How is this treated? There is no cure for a bunion, but treatment can help to prevent a bunion from getting worse. Treatment depends on the severity of your symptoms. Your health care provider may recommend:  Wearing shoes that have a wide toe box.  Using bunion pads to cushion the affected area.  Taping your toes together to keep them in a normal position.  Placing a device inside your shoe (orthotics) to help reduce pressure on your toe joint.  Taking  medicine to ease pain, inflammation, and swelling.  Applying heat or ice to the affected area.  Doing stretching exercises.  Surgery to remove scar tissue and move the toes back into their normal position. This treatment is rare.  Follow these instructions at home:  Support your toe joint with proper footwear, shoe padding, or taping as told by your health care provider.  Take over-the-counter and prescription medicines only as told by your health care provider.  If directed, apply ice to the injured area: ? Put ice in a plastic bag. ? Place a towel between your skin and the bag. ? Leave the ice on for 20 minutes, 2-3 times per day.  If directed, apply heat to the affected area before you exercise. Use the heat source that your health care provider recommends, such as a moist heat pack or a heating pad. ? Place a towel between  your skin and the heat source. ? Leave the heat on for 20-30 minutes. ? Remove the heat if your skin turns bright red. This is especially important if you are unable to feel pain, heat, or cold. You may have a greater risk of getting burned.  Do exercises as told by your health care provider.  Keep all follow-up visits as told by your health care provider. Contact a health care provider if:  Your symptoms get worse.  Your symptoms do not improve in 2 weeks. Get help right away if:  You have severe pain and trouble with walking. This information is not intended to replace advice given to you by your health care provider. Make sure you discuss any questions you have with your health care provider. Document Released: 03/25/2005 Document Revised: 08/31/2015 Document Reviewed: 10/23/2014 Elsevier Interactive Patient Education  [employer].

## 2016-11-27 ENCOUNTER — Ambulatory Visit: Payer: Self-pay | Admitting: Family Medicine

## 2016-11-27 ENCOUNTER — Telehealth: Payer: Self-pay | Admitting: *Deleted

## 2016-11-27 MED FILL — rOPINIRole HCL 1 MG TABS: 1 | 30 days supply | Qty: 30 | Fill #3

## 2016-11-27 NOTE — Telephone Encounter (Signed)
"  I was calling because I scheduled a surgery.  I need to change that if possible.  Give me a call."

## 2016-11-27 NOTE — Telephone Encounter (Signed)
I am returning your call.  You want to reschedule your surgery?  "Yes, I was wondering if I could do it on September 5 or the 26."  He can do it on September 5.  "Perfect, I will go ahead and take care of things with the surgery center that you suggested."

## 2016-12-09 DIAGNOSIS — M2011 Hallux valgus (acquired), right foot: Secondary | ICD-10-CM

## 2016-12-10 ENCOUNTER — Other Ambulatory Visit: Payer: Self-pay | Admitting: Podiatry

## 2016-12-10 MED ORDER — PROMETHAZINE HCL 25 MG PO TABS: 25.0000 mg | ORAL_TABLET | Freq: Three times a day (TID) | ORAL | 0 refills | Status: DC | PRN

## 2016-12-10 MED ORDER — CEPHALEXIN 500 MG PO CAPS: 500.0000 mg | ORAL_CAPSULE | Freq: Three times a day (TID) | ORAL | 0 refills | Status: DC

## 2016-12-10 MED ORDER — OXYCODONE-ACETAMINOPHEN 5-325 MG PO TABS: 1.0000 | ORAL_TABLET | Freq: Four times a day (QID) | ORAL | 0 refills | Status: DC | PRN

## 2016-12-10 NOTE — Progress Notes (Signed)
Orders placed for postop

## 2016-12-11 ENCOUNTER — Other Ambulatory Visit: Payer: Self-pay | Admitting: Podiatry

## 2016-12-11 ENCOUNTER — Encounter: Payer: Self-pay | Admitting: Podiatry

## 2016-12-11 DIAGNOSIS — M21611 Bunion of right foot: Secondary | ICD-10-CM | POA: Diagnosis not present

## 2016-12-11 DIAGNOSIS — K219 Gastro-esophageal reflux disease without esophagitis: Secondary | ICD-10-CM | POA: Diagnosis not present

## 2016-12-11 DIAGNOSIS — M25571 Pain in right ankle and joints of right foot: Secondary | ICD-10-CM | POA: Diagnosis not present

## 2016-12-11 MED FILL — OXYCODONE W/APAP 5/325 TAB: 5-325 | 3 days supply | Qty: 25 | Fill #0

## 2016-12-11 MED FILL — CEPHALEXIN 500 MG CAPSULE: 500 | 7 days supply | Qty: 21 | Fill #0

## 2016-12-11 NOTE — Progress Notes (Signed)
DOS 12/11/16 Bunion correction Rt foot cutting and repositioning bone with screw

## 2016-12-11 NOTE — Progress Notes (Signed)
Pre-operative Note  Patient presents to the [facility] today for surgical intervention of the right foot for bunion correction. The surgical consent was reviewed with the patient and we discussed the procedure as well as the postoperative course. I again discussed all alternatives, risks, complications. I answered all of their questions to the best of my ability and they wish to proceed with surgery. No promises or guarantees were given as to the outcome of the surgery.   The surgical consent was signed.   Patient is NPO since midnight.  The patient does not have have a history of blood clots or bleeding disorders.   No further questions.   Celesta Gentile, [place]

## 2016-12-12 ENCOUNTER — Telehealth: Payer: Self-pay | Admitting: Podiatry

## 2016-12-12 NOTE — Telephone Encounter (Signed)
I spoke with pt's husband, Ronalee Belts and asked the sensation pt was feeling. Pt states she has a lot of pain at the surgery site. I told Ronalee Belts to help pt remove the cam boot, open-ended sock, and ace wrap leaving the gauze in place, then elevate the foot for 15 minutes, but if the pain worsened to dangle the foot for 15 minutes, this being the only time it was okay to dangle the foot, after 15 minutes place the foot level with the hip and beginning at the toe rewrap the ace looser and further up the leg. I told him pt should not be up on the foot more than 15 minutes/hour for the 1st week post op, and to be in the cam walker at all times, but may remove to rest but not sleep. Ronalee Belts states understanding.

## 2016-12-12 NOTE — Telephone Encounter (Signed)
My wife had surgery yesterday and she is in a lot of pain. The pain pills are not working and she says she cannot put any pressure/weight on her foot. Please call me back at [phone]. Thank you.

## 2016-12-13 ENCOUNTER — Telehealth: Payer: Self-pay | Admitting: Podiatry

## 2016-12-13 ENCOUNTER — Telehealth: Payer: Self-pay | Admitting: *Deleted

## 2016-12-13 DIAGNOSIS — Z9889 Other specified postprocedural states: Secondary | ICD-10-CM

## 2016-12-13 NOTE — Addendum Note (Signed)
Addended by: Harriett Sine D on: 12/13/2016 11:19 AM   Modules accepted: Orders

## 2016-12-13 NOTE — Telephone Encounter (Signed)
Called patient and left a message yesterday to see how she was doing after the surgery and to call the [place] office at [phone] with any concerns or questions. Lattie Haw

## 2016-12-13 NOTE — Telephone Encounter (Signed)
I informed pt's husband, Sherri Perry, I would be faxing orders to [facility] for the knee scooter and they should call today to discuss coverage and pick up. Sherri Perry states she is doing a little better, but difficult getting her to the bathroom.

## 2016-12-13 NOTE — Telephone Encounter (Signed)
I was calling to see if my wife can get a knee scooter to use. She had surgery on Wednesday. You can call me back at [phone].

## 2016-12-16 ENCOUNTER — Encounter: Payer: Self-pay | Admitting: Podiatry

## 2016-12-16 ENCOUNTER — Ambulatory Visit (INDEPENDENT_AMBULATORY_CARE_PROVIDER_SITE_OTHER): Payer: 59

## 2016-12-16 ENCOUNTER — Ambulatory Visit (INDEPENDENT_AMBULATORY_CARE_PROVIDER_SITE_OTHER): Payer: Self-pay | Admitting: Podiatry

## 2016-12-16 DIAGNOSIS — M21619 Bunion of unspecified foot: Secondary | ICD-10-CM

## 2016-12-17 NOTE — Progress Notes (Signed)
Subjective: Sherri Perry is a 51 y.o. is seen today in office s/p right Austin bunionectomy preformed on 12/11/2016. They state their pain is minimal and she is not taking any pain medication. Denies any systemic complaints such as fevers, chills, nausea, vomiting. No calf pain, chest pain, shortness of breath.   Objective: General: No acute distress, AAOx3  DP/PT pulses palpable 2/4, CRT < 3 sec to all digits.  Protective sensation intact. Motor function intact.  RIGHT foot: Incision is well coapted without any evidence of dehiscence and suture ends are intact. There is no surrounding erythema, ascending cellulitis, fluctuance, crepitus, malodor, drainage/purulence. There is mild edema around the surgical site. There is minmal pain along the surgical site. Toe sits in a rectus position and she is happy with the position of the toe.  No other areas of tenderness to bilateral lower extremities.  No other open lesions or pre-ulcerative lesions.  No pain with calf compression, swelling, warmth, erythema.   Assessment and Plan:  Status post right foot bunion correction, doing well with no complications   -Treatment options discussed including all alternatives, risks, and complications -X-rays were obtained and reviewed with the patient. Status post bunionectomy. Hardware intact. No evidence of acute fracture identified otherwise. -Antibiotic ointment was applied followed by a bandage. Keep dressing clean, dry, intact. -Cam boot at all times. -Ice/elevation -Pain medication as needed- only taking NSAIDs at this time.  -Monitor for any clinical signs or symptoms of infection and DVT/PE and directed to call the office immediately should any occur or go to the ER. -Follow-up in 10 days for suture removal or sooner if any problems arise. In the meantime, encouraged to call the office with any questions, concerns, change in symptoms.   Celesta Gentile, DPM

## 2016-12-19 DIAGNOSIS — H524 Presbyopia: Secondary | ICD-10-CM | POA: Diagnosis not present

## 2016-12-19 MED FILL — VYVANSE 50 MG CAPSULE: 50 | 90 days supply | Qty: 90 | Fill #0

## 2016-12-20 DIAGNOSIS — Z01419 Encounter for gynecological examination (general) (routine) without abnormal findings: Secondary | ICD-10-CM | POA: Diagnosis not present

## 2016-12-20 DIAGNOSIS — R6882 Decreased libido: Secondary | ICD-10-CM | POA: Diagnosis not present

## 2016-12-23 ENCOUNTER — Encounter: Payer: 59 | Admitting: Podiatry

## 2016-12-25 DIAGNOSIS — F5089 Other specified eating disorder: Secondary | ICD-10-CM | POA: Diagnosis not present

## 2016-12-26 DIAGNOSIS — F5089 Other specified eating disorder: Secondary | ICD-10-CM | POA: Diagnosis not present

## 2016-12-27 ENCOUNTER — Encounter: Payer: Self-pay | Admitting: Podiatry

## 2016-12-27 ENCOUNTER — Ambulatory Visit (INDEPENDENT_AMBULATORY_CARE_PROVIDER_SITE_OTHER): Payer: Self-pay | Admitting: Podiatry

## 2016-12-27 DIAGNOSIS — M21619 Bunion of unspecified foot: Secondary | ICD-10-CM

## 2016-12-27 DIAGNOSIS — B351 Tinea unguium: Secondary | ICD-10-CM

## 2016-12-27 DIAGNOSIS — L608 Other nail disorders: Secondary | ICD-10-CM | POA: Diagnosis not present

## 2016-12-27 DIAGNOSIS — Z9889 Other specified postprocedural states: Secondary | ICD-10-CM

## 2016-12-27 MED ORDER — KETOCONAZOLE 2 % EX CREA: 1.0000 "application " | TOPICAL_CREAM | Freq: Every day | CUTANEOUS | 2 refills | Status: DC

## 2016-12-27 MED FILL — KETOCONAZOLE 2% CREAM: 2 | 30 days supply | Qty: 60 | Fill #0

## 2016-12-30 ENCOUNTER — Ambulatory Visit (INDEPENDENT_AMBULATORY_CARE_PROVIDER_SITE_OTHER): Payer: 59

## 2016-12-30 ENCOUNTER — Ambulatory Visit (INDEPENDENT_AMBULATORY_CARE_PROVIDER_SITE_OTHER): Payer: 59 | Admitting: Podiatry

## 2016-12-30 DIAGNOSIS — M21619 Bunion of unspecified foot: Secondary | ICD-10-CM

## 2016-12-30 NOTE — Progress Notes (Signed)
Subjective: Sherri Perry is a 50 y.o. is seen today in office s/p right Austin bunionectomy preformed on 12/11/2016. She states that she is not having pain. He presents today for suture removal. She is minimally cam boot. Denies any systemic complaints such as fevers, chills, nausea, vomiting. No calf pain, chest pain, shortness of breath.   Objective: General: No acute distress, AAOx3  DP/PT pulses palpable 2/4, CRT < 3 sec to all digits.  Protective sensation intact. Motor function intact.  RIGHT foot: Incision is well coapted without any evidence of dehiscence and suture ends are intact. There is no surrounding erythema, ascending cellulitis, fluctuance, crepitus, malodor, drainage/purulence. There is mild edema around the surgical site. There is minmal pain along the surgical site. Toe sits in a rectus position and she is happy with the position of the toe.  Her right hallux toenail visibly be discolored with yellow to brown discoloration. No other areas of tenderness to bilateral lower extremities.  No other open lesions or pre-ulcerative lesions.  No pain with calf compression, swelling, warmth, erythema.   Assessment and Plan:  Status post right foot bunion correction, doing well with no complications; onychodystrophy  -Treatment options discussed including all alternatives, risks, and complications -Suture ends were cut today. Antibiotic ointment was applied followed by a bandage. She can take the bandage off tomorrow and start to shower. -Continue cam boot at all times. -Ice and elevation -Pain medication as needed but she is not taking this -Ice debrided the left hallux toenails in this for culture to [facility] labs.  -Monitor for any clinical signs or symptoms of infection and directed to call the office immediately should any occur or go to the ER. -RTC 2 weeks or sooner if needed.  Celesta Gentile, DPM

## 2017-01-01 NOTE — Progress Notes (Signed)
Subjective: Sherri Perry is a 51 y.o. is seen today in office s/p right Austin bunionectomy preformed on 12/11/2016. She presents as a work and appointment. She states this when she is getting ready to work she hit her surgical site on a chair and she felt incision opened some. She denies any active bleeding she denies any drainage or pus. She was have the area checked. She has no other concerns. She is continuing the CAM boot but she was not wearing the boot at the time of that injury. Denies any systemic complaints such as fevers, chills, nausea, vomiting. No calf pain, chest pain, shortness of breath.   Objective: General: No acute distress, AAOx3  DP/PT pulses palpable 2/4, CRT < 3 sec to all digits.  Protective sensation intact. Motor function intact.  RIGHT foot: Incision is well coapted without any evidence of dehiscence. There is no evidence of active bleeding. The incision appears however red blood type appearance but there is no open area identified. There is no drainage or pus expressed there is no significant increase in swelling there is no erythema or any ascending cellulitis. There is no second tenderness to palpation on surgical site. The toe does sit in rectus position. She can states that she is happy with the position of the toe at this point. No other areas of tenderness to bilateral lower extremities.  No other open lesions or pre-ulcerative lesions.  No pain with calf compression, swelling, warmth, erythema.   Assessment and Plan:  Status post right foot bunion correction, injury to surgical foot.   -Treatment options discussed including all alternatives, risks, and complications -X-rays were obtained and reviewed. Hardware remains in intact position is no evidence of acute fracture. -Incision appears be doing well. I did apply Steri-Strips are reinforcement followed by antibiotic ointment and a bandage. Continue this daily. Hold off the area with a couple of  days. -Continue with ice and elevation -She is not taking any pain medicine -Continue in CAM boot at all times. -Awaiting nail culture -Monitor for any clinical signs or symptoms of infection and directed to call the office immediately should any occur or go to the ER. -RTC as scheduled or sooner if needed.  Celesta Gentile, DPM

## 2017-01-02 DIAGNOSIS — F5089 Other specified eating disorder: Secondary | ICD-10-CM | POA: Diagnosis not present

## 2017-01-02 MED FILL — rOPINIRole HCL 1 MG TABS: 1 | 30 days supply | Qty: 30 | Fill #4

## 2017-01-03 DIAGNOSIS — F5089 Other specified eating disorder: Secondary | ICD-10-CM | POA: Diagnosis not present

## 2017-01-09 ENCOUNTER — Encounter: Payer: Self-pay | Admitting: Psychology

## 2017-01-10 ENCOUNTER — Encounter: Payer: Self-pay | Admitting: Podiatry

## 2017-01-10 ENCOUNTER — Ambulatory Visit (INDEPENDENT_AMBULATORY_CARE_PROVIDER_SITE_OTHER): Payer: Self-pay | Admitting: Podiatry

## 2017-01-10 DIAGNOSIS — L603 Nail dystrophy: Secondary | ICD-10-CM

## 2017-01-10 DIAGNOSIS — M21619 Bunion of unspecified foot: Secondary | ICD-10-CM

## 2017-01-10 NOTE — Progress Notes (Signed)
Subjective: Sherri Perry is a 51 y.o. is seen today in office s/p right Austin bunionectomy preformed on 12/11/2016. She states that she's been doing well she's having no pain or any issues. She is continuing to wear the surgical boot when walking. She denies any recent injury or trauma since last appointment. She also presents discussed nail culture results. She has no new concerns today. Denies any systemic complaints such as fevers, chills, nausea, vomiting. No calf pain, chest pain, shortness of breath.   Objective: General: No acute distress, AAOx3  DP/PT pulses palpable 2/4, CRT < 3 sec to all digits.  Protective sensation intact. Motor function intact.  RIGHT foot: Incision is well coapted without any evidence of dehiscence and a scar has formed. There is mild edema to the surgical site but there is no pain along the surgical site. MPJ range of motion intact with only minimal restriction in dorsiflexion. There is no other areas of tenderness identified this time. Incision appears to be healing well as well as surgical area. The toenail does. Be somewhat discolored towards the distal aspect of the toenail but there is no pain of the nail is no swelling redness or drainage. No other areas of tenderness to bilateral lower extremities.  No other open lesions or pre-ulcerative lesions.  No pain with calf compression, swelling, warmth, erythema.   Assessment and Plan:  Status post right foot bunion correction, doing well without any complications; onychodystrophy  -Treatment options discussed including all alternatives, risks, and complications -At this point discussed transitioning to a surgical shoe as tolerated. This was dispensed to her today. Continue ice and elevation. Anti-inflammatories as needed but she is not taking any narcotic pain medication. -Discussed nail culture results which are negative for nail fungus. She's been using tea tree oil to her nail which has been helping some.  Discussed this could be a false negatives she's been using over-the-counter antifungal and case of nail fungus. Follow up in 2 weeks or sooner if any issues are to arise. Call any questions or concerns.  *x-ray next appointment   Celesta Gentile, DPM

## 2017-01-16 MED FILL — traZODone HCL 50 MG TABS: 50 | 90 days supply | Qty: 90 | Fill #2

## 2017-01-22 DIAGNOSIS — N952 Postmenopausal atrophic vaginitis: Secondary | ICD-10-CM | POA: Diagnosis not present

## 2017-01-22 DIAGNOSIS — R6882 Decreased libido: Secondary | ICD-10-CM | POA: Diagnosis not present

## 2017-01-23 DIAGNOSIS — D225 Melanocytic nevi of trunk: Secondary | ICD-10-CM | POA: Diagnosis not present

## 2017-01-23 DIAGNOSIS — L821 Other seborrheic keratosis: Secondary | ICD-10-CM | POA: Diagnosis not present

## 2017-01-23 DIAGNOSIS — D2272 Melanocytic nevi of left lower limb, including hip: Secondary | ICD-10-CM | POA: Diagnosis not present

## 2017-01-23 DIAGNOSIS — L301 Dyshidrosis [pompholyx]: Secondary | ICD-10-CM | POA: Diagnosis not present

## 2017-01-23 DIAGNOSIS — D2262 Melanocytic nevi of left upper limb, including shoulder: Secondary | ICD-10-CM | POA: Diagnosis not present

## 2017-01-24 ENCOUNTER — Ambulatory Visit (INDEPENDENT_AMBULATORY_CARE_PROVIDER_SITE_OTHER): Payer: 59

## 2017-01-24 ENCOUNTER — Ambulatory Visit (INDEPENDENT_AMBULATORY_CARE_PROVIDER_SITE_OTHER): Payer: 59 | Admitting: Podiatry

## 2017-01-24 ENCOUNTER — Encounter: Payer: Self-pay | Admitting: Podiatry

## 2017-01-24 DIAGNOSIS — M21619 Bunion of unspecified foot: Secondary | ICD-10-CM

## 2017-01-24 NOTE — Progress Notes (Signed)
Subjective: Sherri Perry is a 51 y.o. is seen today in office s/p right Austin bunionectomy preformed on 12/11/2016. She states that she is doing "excellent".  She has been wearing a surgical shoe. She has no concerns today. She has been doing her ROM exercises for the big toe joint daily. She states the swelling has improved quite a bit. She has been on her foot more today and there is more swelling today than normal. Denies any systemic complaints such as fevers, chills, nausea, vomiting. No calf pain, chest pain, shortness of breath.   Objective: General: No acute distress, AAOx3  DP/PT pulses palpable 2/4, CRT < 3 sec to all digits.  Protective sensation intact. Motor function intact.  RIGHT foot: Incision is well coapted without any evidence of dehiscence and a scar has formed. There is minimal edema to the surgical site but there is no pain along the surgical site. MPJ range of motion intact. The toe is in rectus position and she is doing well. She is pleased with the position of the toe.  There are no other areas of tenderness identified this time. No other areas of tenderness to bilateral lower extremities.  No other open lesions or pre-ulcerative lesions.  No pain with calf compression, swelling, warmth, erythema.   Assessment and Plan:  Status post right foot bunion correction, doing well without any complications; onychodystrophy  -Treatment options discussed including all alternatives, risks, and complications -X-rays were reviewed. Healing across the ostomy is present. Hardware intact. -She can start to transition to regular shoe as tolerated with a gradual transition. Discussed supportive shoes.  -Continue ice and elevation. -She is not taking any pain medication -Continue range of motion exercises. Will hold off on PT as she is doing well and no pain, only minimal swelling and there is good ROM of the 1st MTPJ.  -Follow-up as scheduled or sooner if needed. She has no further  questions today.   *x-ray next appointment   Celesta Gentile, DPM

## 2017-01-27 DIAGNOSIS — M545 Low back pain: Secondary | ICD-10-CM | POA: Diagnosis not present

## 2017-01-27 MED FILL — MELOXICAM 15 MG TABLET: 15 | 30 days supply | Qty: 30 | Fill #0

## 2017-02-13 MED FILL — rOPINIRole HCL 1 MG TABS: 1 | 30 days supply | Qty: 30 | Fill #5

## 2017-02-14 DIAGNOSIS — F5089 Other specified eating disorder: Secondary | ICD-10-CM | POA: Diagnosis not present

## 2017-02-17 MED FILL — FLUoxetine HCL 40 MG CAPS: 40 | 90 days supply | Qty: 90 | Fill #2

## 2017-02-24 ENCOUNTER — Ambulatory Visit (INDEPENDENT_AMBULATORY_CARE_PROVIDER_SITE_OTHER): Payer: 59

## 2017-02-24 ENCOUNTER — Ambulatory Visit (INDEPENDENT_AMBULATORY_CARE_PROVIDER_SITE_OTHER): Payer: 59 | Admitting: Podiatry

## 2017-02-24 DIAGNOSIS — M21619 Bunion of unspecified foot: Secondary | ICD-10-CM | POA: Diagnosis not present

## 2017-02-24 NOTE — Progress Notes (Signed)
Subjective: Sherri Perry is a 51 y.o. is seen today in office s/p right Austin bunionectomy preformed on 12/11/2016.  She states that overall she is doing well she is avoiding regular she is not having any pain.  She is able to do her daily activities without any problems.  Overall she states that she is very happy with the surgery.  She has no other concerns today.  No recent injury or trauma.  Denies any systemic complaints such as fevers, chills, nausea, vomiting. No calf pain, chest pain, shortness of breath.   Objective: General: No acute distress, AAOx3  DP/PT pulses palpable 2/4, CRT < 3 sec to all digits.  Protective sensation intact. Motor function intact.  RIGHT foot: Incision is well coapted without any evidence of dehiscence and a scar has formed. There is no pain along the surgical site. There is no swelling, redness, drainage, open sores or any clinical signs of infection present.  There is adequate range of motion of the first MTPJ there is no restriction or crepitation with range of motion.  There is no tenderness at the surgical site.  There is no overlying edema.  Incision and surgery appears to be healing well. No pain with calf compression, swelling, warmth, erythema.   Assessment and Plan:  Status post right foot bunion correction, doing well without any complications  -Treatment options discussed including all alternatives, risks, and complications -X-rays were reviewed.  There is evidence of healing across the ostomy is present. Hardware intact.  -At this point she is doing well.  She has no pain to the surgical site right she is wearing regular shoes without any issues. She is very pleased with the outcome of the surgery at this point. Discussed to continue with cocoa butter or vitamin E cream on the incision to help with scarring although minimal. I am going to discharge her from the post-op course at this point and she agrees to this. Encouraged to call with any  questions, concerns, change in symptoms.   Celesta Gentile, DPM

## 2017-03-18 MED FILL — rOPINIRole HCL 1 MG TABS: 1 | 30 days supply | Qty: 30 | Fill #6

## 2017-03-18 MED FILL — MELOXICAM 15 MG TABLET: 15 | 30 days supply | Qty: 30 | Fill #1

## 2017-03-20 ENCOUNTER — Telehealth: Payer: Self-pay | Admitting: Podiatry

## 2017-03-20 NOTE — Telephone Encounter (Signed)
Left message for pt to call for an appt.

## 2017-03-20 NOTE — Telephone Encounter (Signed)
I had foot surgery in September by Dr. Jacqualyn Posey and Rene Paci had my final post-op visit. For the past couple of weeks now I've been having this pain underneath almost like in the ball of the foot towards the big toe. Sometimes when I'm walking I will get a sharp pain. I don't know if that is normal and if I'm still healing. I haven't had any problems until the past couple of weeks. I didn't know if that is part of being back in normal shoes or what not. Is it something I should make an appointment about? Please call me back at [phone] and you can leave a voicemail if I'm unable to answer. Thank you.

## 2017-04-04 ENCOUNTER — Other Ambulatory Visit: Payer: Self-pay | Admitting: Family Medicine

## 2017-04-04 ENCOUNTER — Ambulatory Visit
Admission: RE | Admit: 2017-04-04 | Discharge: 2017-04-04 | Disposition: A | Discharge status: Home / Self Care | Payer: 59 | Source: Ambulatory Visit | Attending: Family Medicine | Admitting: Family Medicine

## 2017-04-04 DIAGNOSIS — R2241 Localized swelling, mass and lump, right lower limb: Secondary | ICD-10-CM | POA: Diagnosis not present

## 2017-04-04 DIAGNOSIS — R599 Enlarged lymph nodes, unspecified: Secondary | ICD-10-CM

## 2017-04-04 DIAGNOSIS — R2242 Localized swelling, mass and lump, left lower limb: Secondary | ICD-10-CM | POA: Diagnosis not present

## 2017-04-04 DIAGNOSIS — G4700 Insomnia, unspecified: Secondary | ICD-10-CM | POA: Diagnosis not present

## 2017-04-04 DIAGNOSIS — E782 Mixed hyperlipidemia: Secondary | ICD-10-CM | POA: Diagnosis not present

## 2017-04-04 DIAGNOSIS — R591 Generalized enlarged lymph nodes: Secondary | ICD-10-CM | POA: Diagnosis not present

## 2017-04-04 DIAGNOSIS — M79604 Pain in right leg: Secondary | ICD-10-CM | POA: Diagnosis not present

## 2017-04-04 DIAGNOSIS — G2581 Restless legs syndrome: Secondary | ICD-10-CM | POA: Diagnosis not present

## 2017-04-07 MED FILL — VYVANSE 50 MG CAPSULE: 50 | 90 days supply | Qty: 90 | Fill #0

## 2017-04-09 MED FILL — EZETIMIBE 10 MG TABS: 10 | 90 days supply | Qty: 90 | Fill #0

## 2017-04-11 DIAGNOSIS — F5089 Other specified eating disorder: Secondary | ICD-10-CM | POA: Diagnosis not present

## 2017-04-14 DIAGNOSIS — N951 Menopausal and female climacteric states: Secondary | ICD-10-CM | POA: Diagnosis not present

## 2017-04-14 MED FILL — ESTRADIOL 0.1 MG/DAY PATCH: 0.1 | 28 days supply | Qty: 4 | Fill #0

## 2017-04-18 MED FILL — traZODone HCL 50 MG TABS: 50 | 90 days supply | Qty: 90 | Fill #3

## 2017-04-18 MED FILL — rOPINIRole HCL 1 MG TABS: 1 | 30 days supply | Qty: 30 | Fill #7

## 2017-04-29 MED FILL — VYVANSE 60 MG CAPSULE: 60 | 30 days supply | Qty: 30 | Fill #0

## 2017-04-30 DIAGNOSIS — R109 Unspecified abdominal pain: Secondary | ICD-10-CM | POA: Diagnosis not present

## 2017-04-30 DIAGNOSIS — M545 Low back pain: Secondary | ICD-10-CM | POA: Diagnosis not present

## 2017-04-30 DIAGNOSIS — N3001 Acute cystitis with hematuria: Secondary | ICD-10-CM | POA: Diagnosis not present

## 2017-04-30 DIAGNOSIS — K5792 Diverticulitis of intestine, part unspecified, without perforation or abscess without bleeding: Secondary | ICD-10-CM | POA: Diagnosis not present

## 2017-04-30 DIAGNOSIS — R1032 Left lower quadrant pain: Secondary | ICD-10-CM | POA: Diagnosis not present

## 2017-04-30 MED FILL — metroNIDAZOLE 500 MG TABS: 500 | 10 days supply | Qty: 30 | Fill #0

## 2017-04-30 MED FILL — SULFAMETHOXAZOLE-TMP DS TAB: 800-160 | 10 days supply | Qty: 20 | Fill #0

## 2017-05-05 ENCOUNTER — Other Ambulatory Visit: Payer: Self-pay | Admitting: Family Medicine

## 2017-05-05 DIAGNOSIS — Z139 Encounter for screening, unspecified: Secondary | ICD-10-CM

## 2017-05-07 DIAGNOSIS — R3915 Urgency of urination: Secondary | ICD-10-CM | POA: Diagnosis not present

## 2017-05-07 DIAGNOSIS — R102 Pelvic and perineal pain: Secondary | ICD-10-CM | POA: Diagnosis not present

## 2017-05-13 MED FILL — ESTRADIOL 0.1 MG/DAY PATCH: 0.1 | 28 days supply | Qty: 4 | Fill #1

## 2017-05-14 MED FILL — FLUoxetine HCL 40 MG CAPS: 40 | 90 days supply | Qty: 90 | Fill #0

## 2017-05-14 MED FILL — buPROPion HCL ER (XL) 150 M: 150 | 90 days supply | Qty: 270 | Fill #0

## 2017-05-20 MED FILL — rOPINIRole HCL 1 MG TABS: 1 | 30 days supply | Qty: 30 | Fill #8

## 2017-05-21 MED FILL — ESTRADIOL 0.1 MG/24HR PTTW: 0.1 | 28 days supply | Qty: 8 | Fill #0

## 2017-05-22 DIAGNOSIS — R5383 Other fatigue: Secondary | ICD-10-CM | POA: Diagnosis not present

## 2017-05-22 DIAGNOSIS — N951 Menopausal and female climacteric states: Secondary | ICD-10-CM | POA: Diagnosis not present

## 2017-05-22 DIAGNOSIS — K589 Irritable bowel syndrome without diarrhea: Secondary | ICD-10-CM | POA: Diagnosis not present

## 2017-05-22 DIAGNOSIS — G4700 Insomnia, unspecified: Secondary | ICD-10-CM | POA: Diagnosis not present

## 2017-05-22 DIAGNOSIS — E559 Vitamin D deficiency, unspecified: Secondary | ICD-10-CM | POA: Diagnosis not present

## 2017-05-23 ENCOUNTER — Ambulatory Visit: Payer: Self-pay

## 2017-05-27 MED FILL — PROMETHAZINE 12.5 MG TABLET: 12.5 | 3 days supply | Qty: 10 | Fill #0

## 2017-05-27 MED FILL — CEPHALEXIN 500 MG CAPSULE: 500 | 5 days supply | Qty: 15 | Fill #0

## 2017-05-27 MED FILL — ERYTHROMYCIN EYE OINTMENT: 5 | 5 days supply | Qty: 4 | Fill #0

## 2017-05-27 MED FILL — HYDROCODON-APAP 7.5-325: 7.5-325 | 3 days supply | Qty: 30 | Fill #0

## 2017-05-29 ENCOUNTER — Other Ambulatory Visit: Payer: Self-pay | Admitting: Family Medicine

## 2017-05-29 DIAGNOSIS — R102 Pelvic and perineal pain: Secondary | ICD-10-CM

## 2017-05-29 DIAGNOSIS — R3915 Urgency of urination: Secondary | ICD-10-CM

## 2017-05-30 ENCOUNTER — Other Ambulatory Visit: Payer: Self-pay | Admitting: Family Medicine

## 2017-05-30 DIAGNOSIS — R102 Pelvic and perineal pain: Secondary | ICD-10-CM

## 2017-05-30 DIAGNOSIS — R109 Unspecified abdominal pain: Secondary | ICD-10-CM

## 2017-05-30 DIAGNOSIS — R3915 Urgency of urination: Secondary | ICD-10-CM

## 2017-06-04 ENCOUNTER — Ambulatory Visit
Admission: RE | Admit: 2017-06-04 | Discharge: 2017-06-04 | Disposition: A | Discharge status: Home / Self Care | Payer: 59 | Source: Ambulatory Visit | Attending: Family Medicine | Admitting: Family Medicine

## 2017-06-04 DIAGNOSIS — Z1231 Encounter for screening mammogram for malignant neoplasm of breast: Secondary | ICD-10-CM | POA: Diagnosis not present

## 2017-06-04 DIAGNOSIS — Z139 Encounter for screening, unspecified: Secondary | ICD-10-CM

## 2017-06-10 MED FILL — SULFAMETHOXAZOLE-TMP DS TAB: 800-160 | 7 days supply | Qty: 14 | Fill #0

## 2017-06-23 DIAGNOSIS — H524 Presbyopia: Secondary | ICD-10-CM | POA: Diagnosis not present

## 2017-06-23 DIAGNOSIS — H5203 Hypermetropia, bilateral: Secondary | ICD-10-CM | POA: Diagnosis not present

## 2017-06-23 DIAGNOSIS — H52221 Regular astigmatism, right eye: Secondary | ICD-10-CM | POA: Diagnosis not present

## 2017-06-23 MED FILL — rOPINIRole HCL 1 MG TABS: 1 | 30 days supply | Qty: 30 | Fill #9

## 2017-06-23 MED FILL — ESTRADIOL 0.1 MG/24HR PTTW: 0.1 | 28 days supply | Qty: 8 | Fill #1

## 2017-06-23 MED FILL — VYVANSE 60 MG CAPSULE: 60 | 30 days supply | Qty: 30 | Fill #0

## 2017-06-25 DIAGNOSIS — F5089 Other specified eating disorder: Secondary | ICD-10-CM | POA: Diagnosis not present

## 2017-07-02 DIAGNOSIS — F5089 Other specified eating disorder: Secondary | ICD-10-CM | POA: Diagnosis not present

## 2017-07-07 LAB — HM MAMMOGRAPHY

## 2017-07-22 MED FILL — rOPINIRole HCL 1 MG TABS: 1 | 30 days supply | Qty: 30 | Fill #10

## 2017-08-01 MED FILL — traZODone HCL 50 MG TABS: 50 | 90 days supply | Qty: 90 | Fill #0

## 2017-08-08 MED FILL — VYVANSE 60 MG CAPSULE: 60 | 30 days supply | Qty: 30 | Fill #0

## 2017-08-13 DIAGNOSIS — F5089 Other specified eating disorder: Secondary | ICD-10-CM | POA: Diagnosis not present

## 2017-08-15 MED FILL — FLUoxetine HCL 40 MG CAPS: 40 | 90 days supply | Qty: 90 | Fill #1

## 2017-08-20 MED FILL — rOPINIRole HCL 1 MG TABS: 1 | 30 days supply | Qty: 30 | Fill #11

## 2017-09-10 DIAGNOSIS — F5089 Other specified eating disorder: Secondary | ICD-10-CM | POA: Diagnosis not present

## 2017-09-17 MED FILL — rOPINIRole HCL 1 MG TABS: 1 | 30 days supply | Qty: 30 | Fill #0

## 2017-09-17 MED FILL — BUPROPION HCL XL 150 MG TAB: 150 | 90 days supply | Qty: 270 | Fill #1

## 2017-09-26 MED FILL — VYVANSE 60 MG CAPSULE: 60 | 30 days supply | Qty: 30 | Fill #0

## 2017-10-08 DIAGNOSIS — F5089 Other specified eating disorder: Secondary | ICD-10-CM | POA: Diagnosis not present

## 2017-10-29 DIAGNOSIS — F5089 Other specified eating disorder: Secondary | ICD-10-CM | POA: Diagnosis not present

## 2017-10-29 MED FILL — traZODone HCL 50 MG TABS: 50 | 90 days supply | Qty: 90 | Fill #1

## 2017-11-05 DIAGNOSIS — F5089 Other specified eating disorder: Secondary | ICD-10-CM | POA: Diagnosis not present

## 2017-11-11 DIAGNOSIS — F5089 Other specified eating disorder: Secondary | ICD-10-CM | POA: Diagnosis not present

## 2017-11-26 MED FILL — FLUoxetine HCL 40 MG CAPS: 40 | 90 days supply | Qty: 90 | Fill #2

## 2017-12-07 LAB — HM PAP SMEAR: HM Pap smear: NORMAL

## 2017-12-15 DIAGNOSIS — M67912 Unspecified disorder of synovium and tendon, left shoulder: Secondary | ICD-10-CM | POA: Diagnosis not present

## 2017-12-19 MED FILL — VYVANSE 60 MG CAPSULE: 60 | 15 days supply | Qty: 15 | Fill #0

## 2017-12-23 DIAGNOSIS — Z01419 Encounter for gynecological examination (general) (routine) without abnormal findings: Secondary | ICD-10-CM | POA: Diagnosis not present

## 2017-12-23 DIAGNOSIS — Z13820 Encounter for screening for osteoporosis: Secondary | ICD-10-CM | POA: Diagnosis not present

## 2017-12-23 DIAGNOSIS — Z6832 Body mass index (BMI) 32.0-32.9, adult: Secondary | ICD-10-CM | POA: Diagnosis not present

## 2018-04-20 ENCOUNTER — Ambulatory Visit: Payer: Self-pay | Admitting: Podiatry

## 2018-06-09 ENCOUNTER — Ambulatory Visit: Payer: Self-pay | Admitting: Podiatry

## 2018-06-11 ENCOUNTER — Ambulatory Visit (INDEPENDENT_AMBULATORY_CARE_PROVIDER_SITE_OTHER): Payer: 59

## 2018-06-11 ENCOUNTER — Ambulatory Visit (INDEPENDENT_AMBULATORY_CARE_PROVIDER_SITE_OTHER): Payer: 59 | Admitting: Podiatry

## 2018-06-11 ENCOUNTER — Encounter: Payer: Self-pay | Admitting: Podiatry

## 2018-06-11 DIAGNOSIS — M722 Plantar fascial fibromatosis: Secondary | ICD-10-CM | POA: Diagnosis not present

## 2018-06-11 DIAGNOSIS — S99921A Unspecified injury of right foot, initial encounter: Secondary | ICD-10-CM

## 2018-06-11 DIAGNOSIS — M779 Enthesopathy, unspecified: Secondary | ICD-10-CM | POA: Diagnosis not present

## 2018-06-11 MED ORDER — MELOXICAM 15 MG PO TABS: 15.0000 mg | ORAL_TABLET | Freq: Every day | ORAL | 0 refills | Status: DC

## 2018-06-11 NOTE — Patient Instructions (Signed)

## 2018-06-15 NOTE — Progress Notes (Signed)
Subjective: 53 year old female presents the office today for concerns of pain on the bunion site.  She previously underwent also bunionectomy performed in September 2018.  She states that she misstepped the end of January and she jammed her toe and since then she has had some increasing pain and swelling to the area.  She denies any redness.  She was having some intermittent discomfort at times prior to the injury but this did make it worse.  She said no recent treatment.  She has no other concerns. Denies any systemic complaints such as fevers, chills, nausea, vomiting. No acute changes since last appointment, and no other complaints at this time.   Objective: AAO x3, NAD DP/PT pulses palpable bilaterally, CRT less than 3 seconds Scar is well formed for the prior surgery.  There is mild swelling to the first MPJ but there is no erythema or warmth.  There is no fluctuation crepitation.  There is no restriction with MPJ range of motion.  No other area of tenderness elicited at this time.   No pain with calf compression, swelling, warmth, erythema  Assessment: RIGHT foot capsulitis first MPJ  Plan: -All treatment options discussed with the patient including all alternatives, risks, complications.  -X-rays were obtained reviewed.  Hardware intact.  No evidence of acute fracture. -Steroid injection was performed.  The skin was prepped with Betadine and then mixture of 1 cc dexamethasone phosphate and 1 cc Marcaine plain was infiltrated into around the first MPJ without complications.  Postinjection care was discussed. -Offloading pads dispensed. -Discussed shoe modifications. -Patient encouraged to call the office with any questions, concerns, change in symptoms.   Return in about 4 weeks (around 07/09/2018).

## 2018-07-09 ENCOUNTER — Ambulatory Visit: Payer: 59 | Admitting: Podiatry

## 2018-09-14 ENCOUNTER — Other Ambulatory Visit: Payer: Self-pay | Admitting: Family Medicine

## 2018-09-14 ENCOUNTER — Telehealth: Payer: Self-pay | Admitting: Family Medicine

## 2018-09-14 DIAGNOSIS — Z1231 Encounter for screening mammogram for malignant neoplasm of breast: Secondary | ICD-10-CM

## 2018-09-14 NOTE — Telephone Encounter (Signed)
Pt called in asking if Birdie Riddle would take her own as a NP, pt can be reached at the home #

## 2018-09-17 NOTE — Telephone Encounter (Signed)
I am not able to accept at this time (unless pt has a family member that I see).  Einar Pheasant is an excellent option if she is interested in this office.

## 2018-09-18 NOTE — Telephone Encounter (Signed)
LM making pt aware of this

## 2018-10-07 ENCOUNTER — Other Ambulatory Visit: Payer: Self-pay

## 2018-10-07 ENCOUNTER — Ambulatory Visit (INDEPENDENT_AMBULATORY_CARE_PROVIDER_SITE_OTHER): Payer: 59 | Admitting: Physician Assistant

## 2018-10-07 ENCOUNTER — Encounter: Payer: Self-pay | Admitting: Physician Assistant

## 2018-10-07 VITALS — Ht 62.0 in | Wt 187.0 lb

## 2018-10-07 DIAGNOSIS — K219 Gastro-esophageal reflux disease without esophagitis: Secondary | ICD-10-CM

## 2018-10-07 DIAGNOSIS — F329 Major depressive disorder, single episode, unspecified: Secondary | ICD-10-CM

## 2018-10-07 DIAGNOSIS — F988 Other specified behavioral and emotional disorders with onset usually occurring in childhood and adolescence: Secondary | ICD-10-CM

## 2018-10-07 DIAGNOSIS — F419 Anxiety disorder, unspecified: Secondary | ICD-10-CM

## 2018-10-07 DIAGNOSIS — M79642 Pain in left hand: Secondary | ICD-10-CM

## 2018-10-07 DIAGNOSIS — R002 Palpitations: Secondary | ICD-10-CM | POA: Diagnosis not present

## 2018-10-07 DIAGNOSIS — F32A Depression, unspecified: Secondary | ICD-10-CM

## 2018-10-07 DIAGNOSIS — M79641 Pain in right hand: Secondary | ICD-10-CM

## 2018-10-07 MED ORDER — PANTOPRAZOLE SODIUM 40 MG PO TBEC: 40.0000 mg | DELAYED_RELEASE_TABLET | Freq: Every day | ORAL | 3 refills | Status: DC

## 2018-10-07 NOTE — Progress Notes (Signed)
I have discussed the procedure for the virtual visit with the patient who has given consent to proceed with assessment and treatment.   Sherri Perry, CMA     

## 2018-10-07 NOTE — Progress Notes (Signed)
Virtual Visit via Video   I connected with patient on 10/07/18 at  9:00 AM EDT by a video enabled telemedicine application and verified that I am speaking with the correct person using two identifiers.  Location patient: Home Location provider: [facility], Office Persons participating in the virtual visit: Patient, Provider, PA-Student Anibal Henderson), CMA (Eduard Clos)  I discussed the limitations of evaluation and management by telemedicine and the availability of in person appointments. The patient expressed understanding and agreed to proceed.  Subjective:   HPI:   Patient presents via Doxy.Me today to establish care.  Patient with chronic history including anxiety/depression, insomnia and ADD. Is followed by Psychiatry and sees them every 6 months. Is currently on a regimen of Trazodone 50 mg QHS, Trintellix 20 mg QD and Vyvanse 60 mg QD. Notes mood is well controlled with current regimen.. Denies breakthrough symptoms.   Patient endorses noting several weeks of pain in both hands with waking up. Right has improved compared to her left hand, mostly ring finger. Swollen at base of fingers. Notes clicking with with bending, Notes morning stiffness, lasting an hour or so. Pain in morning improves throughout the day 5/10, denies taking anything.  No other joints involved besides hands. Denies recent fevers or chills. Hx of trigger finger- received shot, no issue since. Denies known family history of RA.  Patient also notes a sensation of her Heart skipping beats on occasion for the past few years. Increased frequency the last few weeks. Happening once a day for the last 1.5 weeks. Last 10-15 sec. Caffeine: 1 cup of coffee, bottle or two diet coke Denies:  SOB, light headedness, chest pain or tightness  Never seen by cardiology   Patient also notes heartburn with dry cough and sensation of globus. Denies abdominal pain, nausea or vomiting. Denies chest pain or SOB..  ROS:   Review of Systems  Constitutional: Negative for fever and malaise/fatigue.  Eyes: Negative for blurred vision and double vision.  Respiratory: Positive for cough. Negative for hemoptysis, sputum production, shortness of breath and wheezing.   Cardiovascular: Positive for palpitations. Negative for chest pain.  Gastrointestinal: Negative for abdominal pain, heartburn, nausea and vomiting.  Neurological: Negative for dizziness and headaches.     Patient Active Problem List   Diagnosis Date Noted  . Bunion 11/15/2016    Social History   Tobacco Use  . Smoking status: Former Smoker    Quit date: 02/11/1994    Years since quitting: 24.6  . Smokeless tobacco: Never Used  Substance Use Topics  . Alcohol use: Yes    Comment: occasional    Current Outpatient Medications:  .  estradiol (ESTRACE) 0.1 MG/GM vaginal cream, Place 1 Applicatorful vaginally 3 (three) times a week., Disp: , Rfl:  .  traZODone (DESYREL) 50 MG tablet, Take 50 mg by mouth at bedtime., Disp: , Rfl: 4 .  vortioxetine HBr (TRINTELLIX) 20 MG TABS tablet, Take 20 mg by mouth daily., Disp: , Rfl:  .  VYVANSE 60 MG capsule, Take 60 mg by mouth every morning., Disp: , Rfl:   No Known Allergies  Objective:   Ht '5\' 2"'  (1.575 m)   Wt 187 lb (84.8 kg)   BMI 34.20 kg/m   Patient is well-developed, well-nourished in no acute distress.  Resting comfortably in parked car.  Head is normocephalic, atraumatic.  No labored breathing.  Speech is clear and coherent with logical contest.  Patient is alert and oriented at baseline.   Assessment and  Plan:    1. Gastroesophageal reflux disease without esophagitis Start trial of Pantoprazole over the next 2 weeks. Begin GERD diet. In-office follow-up scheduled.  - pantoprazole (PROTONIX) 40 MG tablet; Take 1 tablet (40 mg total) by mouth daily.  Dispense: 30 tablet; Refill: 3  2. Pain in both hands Concern for RA giving symptomology. In-office visit scheduled so that we can  do a more detailed exam, consider imaging, obtain RF, Anti-CCP and ESR. Supportive measures reviewed.   3. Palpitations Sound like PVCs but needs further assessment. Limit caffeine and avoid alcohol. In-office visit scheduled for examination, EKG and labs. Strict ER precautions reviewed.   4. Anxiety and depression Stable. Continue management per Psychiatry.  5. Attention deficit disorder (ADD) without hyperactivity Stable. COntinue management per Psychiatry.   Leeanne Rio, PA-C 10/07/2018

## 2018-10-12 ENCOUNTER — Ambulatory Visit: Payer: 59 | Admitting: Physician Assistant

## 2018-10-12 DIAGNOSIS — Z0289 Encounter for other administrative examinations: Secondary | ICD-10-CM

## 2018-10-14 ENCOUNTER — Other Ambulatory Visit: Payer: Self-pay

## 2018-10-14 ENCOUNTER — Encounter: Payer: Self-pay | Admitting: Physician Assistant

## 2018-10-14 ENCOUNTER — Ambulatory Visit (INDEPENDENT_AMBULATORY_CARE_PROVIDER_SITE_OTHER): Payer: 59 | Admitting: Physician Assistant

## 2018-10-14 VITALS — BP 128/86 | HR 97 | Temp 98.6°F | Resp 16 | Ht 62.0 in | Wt 192.0 lb

## 2018-10-14 DIAGNOSIS — G8929 Other chronic pain: Secondary | ICD-10-CM

## 2018-10-14 DIAGNOSIS — M25541 Pain in joints of right hand: Secondary | ICD-10-CM

## 2018-10-14 DIAGNOSIS — R002 Palpitations: Secondary | ICD-10-CM | POA: Diagnosis not present

## 2018-10-14 DIAGNOSIS — M25552 Pain in left hip: Secondary | ICD-10-CM | POA: Diagnosis not present

## 2018-10-14 DIAGNOSIS — M25542 Pain in joints of left hand: Secondary | ICD-10-CM | POA: Diagnosis not present

## 2018-10-14 DIAGNOSIS — K219 Gastro-esophageal reflux disease without esophagitis: Secondary | ICD-10-CM

## 2018-10-14 LAB — CBC WITH DIFFERENTIAL/PLATELET
Basophils Absolute: 0 10*3/uL (ref 0.0–0.1)
Basophils Relative: 0.7 % (ref 0.0–3.0)
Eosinophils Absolute: 0.1 10*3/uL (ref 0.0–0.7)
Eosinophils Relative: 1.1 % (ref 0.0–5.0)
HCT: 40.7 % (ref 36.0–46.0)
Hemoglobin: 13.9 g/dL (ref 12.0–15.0)
Lymphocytes Relative: 39.1 % (ref 12.0–46.0)
Lymphs Abs: 2.3 10*3/uL (ref 0.7–4.0)
MCHC: 34.3 g/dL (ref 30.0–36.0)
MCV: 89.1 fl (ref 78.0–100.0)
Monocytes Absolute: 0.3 10*3/uL (ref 0.1–1.0)
Monocytes Relative: 5 % (ref 3.0–12.0)
Neutro Abs: 3.2 10*3/uL (ref 1.4–7.7)
Neutrophils Relative %: 54.1 % (ref 43.0–77.0)
Platelets: 273 10*3/uL (ref 150.0–400.0)
RBC: 4.56 Mil/uL (ref 3.87–5.11)
RDW: 12.8 % (ref 11.5–15.5)
WBC: 5.9 10*3/uL (ref 4.0–10.5)

## 2018-10-14 LAB — SEDIMENTATION RATE: Sed Rate: 23 mm/hr (ref 0–30)

## 2018-10-14 LAB — TSH: TSH: 1.12 u[IU]/mL (ref 0.35–4.50)

## 2018-10-14 NOTE — Patient Instructions (Signed)
Please go to the lab today for blood work.  I will call you with your results. We will alter treatment regimen(s) if indicated by your results.   You will be contacted to have a holter monitor set up so we can better assess these palpitations you are having. They are likely premature beats but we need to make sure.  You will be contacted for assessment by our Sports Medicine providers so we can get relief of this bursitis and chronic hip pain.  Consider starting a daily Turmeric supplement over-the-counter to help with inflammation. No more than 500 mg per day.   Continue reflux medication as directed.

## 2018-10-14 NOTE — Progress Notes (Signed)
Patient presents to clinic today for follow-up of GERD and further assessment of complaints noted on her video visit last week that needed in-office assessment.   Left hip pain: noticed over the last year, pain/discomfort at night time when trying to go to bed. Able to walk without pain. Denies any radiating pain, numbness, or weakness. No mechanism of injury. Seen by chiropractor in past, was told left leg was slightly shorter then right.   GERD: patient has been taking pantoprazole and reports improvement in cough or globus sensation.   Pain in both hands: pain first thing in the morning, gets better with movement. Currently not taking anything for pain. Also notes swelling bilaterally, unable to wear rings. Ring fingers worst.   Palpitations: mainly happens at night, 1/day, episodes last usually <10 seconds. She reports decrease in caffeine but has not noticed any change to symptoms. She denies any feelings of anxiety when this occurs. Denies chest pain, lightheadedness, or SOB.    Past Medical History:  Diagnosis Date  . Anxiety   . Depression   . GERD (gastroesophageal reflux disease)   . Hyperlipemia   . RLS (restless legs syndrome)   . Sleep apnea    mild, no CPAP needed  . Snores     Current Outpatient Medications on File Prior to Visit  Medication Sig Dispense Refill  . estradiol (ESTRACE) 0.1 MG/GM vaginal cream Place 1 Applicatorful vaginally 3 (three) times a week.    . pantoprazole (PROTONIX) 40 MG tablet Take 1 tablet (40 mg total) by mouth daily. 30 tablet 3  . traZODone (DESYREL) 50 MG tablet Take 50 mg by mouth at bedtime.  4  . vortioxetine HBr (TRINTELLIX) 20 MG TABS tablet Take 20 mg by mouth daily.    Marland Kitchen VYVANSE 60 MG capsule Take 60 mg by mouth every morning.     No current facility-administered medications on file prior to visit.     No Known Allergies  Family History  Problem Relation Age of Onset  . Cancer Mother        Breast  . Depression Mother    . Irritable bowel syndrome Mother   . Heart disease Father   . Depression Sister   . Heart disease Brother   . Heart disease Brother     Social History   Socioeconomic History  . Marital status: Single    Spouse name: Not on file  . Number of children: Not on file  . Years of education: Not on file  . Highest education level: Not on file  Occupational History  . Not on file  Social Needs  . Financial resource strain: Not on file  . Food insecurity    Worry: Not on file    Inability: Not on file  . Transportation needs    Medical: Not on file    Non-medical: Not on file  Tobacco Use  . Smoking status: Former Smoker    Quit date: 02/11/1994    Years since quitting: 24.6  . Smokeless tobacco: Never Used  Substance and Sexual Activity  . Alcohol use: Yes    Comment: occasional  . Drug use: No  . Sexual activity: Yes  Lifestyle  . Physical activity    Days per week: Not on file    Minutes per session: Not on file  . Stress: Not on file  Relationships  . Social Herbalist on phone: Not on file    Gets together: Not on file  Attends religious service: Not on file    Active member of club or organization: Not on file    Attends meetings of clubs or organizations: Not on file    Relationship status: Not on file  Other Topics Concern  . Not on file  Social History Narrative  . Not on file   Review of Systems - See HPI.  All other ROS are negative.  BP 128/86   Pulse 97   Temp 98.6 F (37 C) (Skin)   Resp 16   Ht '5\' 2"'  (1.575 m)   Wt 192 lb (87.1 kg)   SpO2 98%   BMI 35.12 kg/m   Physical Exam Vitals signs reviewed.  Constitutional:      Appearance: Normal appearance.  HENT:     Head: Normocephalic and atraumatic.     Right Ear: Tympanic membrane normal.     Left Ear: Tympanic membrane normal.     Nose: Nose normal.     Mouth/Throat:     Mouth: Mucous membranes are moist.  Eyes:     Conjunctiva/sclera: Conjunctivae normal.  Neck:      Musculoskeletal: Neck supple.  Cardiovascular:     Rate and Rhythm: Normal rate and regular rhythm.     Pulses: Normal pulses.     Heart sounds: Normal heart sounds.  Musculoskeletal:     Left hip: She exhibits normal range of motion, normal strength and no tenderness.     Right hand: Normal. Normal sensation noted. Normal strength noted.     Left hand: Normal. Normal sensation noted. Normal strength noted.  Neurological:     General: No focal deficit present.     Mental Status: She is alert and oriented to person, place, and time.  Psychiatric:        Mood and Affect: Mood normal.     Assessment/Plan: 1. Palpitations EKG with NSR. Sounds like PVCs but needs further assessment. Will check lab panel today as noted below. Will get her set up for 48 hours Holter monitor. Continue limiting caffeine and avoiding alcohol.  - EKG 12-Lead - TSH - CBC w/Diff - Holter monitor - 48 hour; Future  2. Arthralgia of hands, bilateral Exam unremarkable. Significant stiffness and pain noted by patient with intermittent swelling. Will check ESR, RF and Anti-CCP.  - Rheumatoid Factor - Cyclic citrul peptide antibody, IgG ([facility]) - Sedimentation rate  3. Chronic left hip pain Has longstanding history of bursitis and leg length discrepancy. Giving chronicity of symptoms will refer to Sports medicine for further assessment and management.  - Ambulatory referral to Sports Medicine  4. Gastroesophageal reflux disease without esophagitis Much improved. Continue working in [employer]. Continue PPI. Follow-up in 3 months.    Leeanne Rio, PA-C

## 2018-10-15 LAB — RHEUMATOID FACTOR: Rheumatoid fact SerPl-aCnc: <14 IU/mL (ref <14)

## 2018-10-15 LAB — CYCLIC CITRUL PEPTIDE ANTIBODY, IGG: Cyclic Citrullin Peptide Ab: <16 UNITS

## 2018-10-16 ENCOUNTER — Other Ambulatory Visit: Payer: Self-pay | Admitting: Emergency Medicine

## 2018-10-16 DIAGNOSIS — M79642 Pain in left hand: Secondary | ICD-10-CM

## 2018-10-16 DIAGNOSIS — M25541 Pain in joints of right hand: Secondary | ICD-10-CM

## 2018-10-16 DIAGNOSIS — M79641 Pain in right hand: Secondary | ICD-10-CM

## 2018-10-20 ENCOUNTER — Telehealth: Payer: Self-pay | Admitting: *Deleted

## 2018-10-20 NOTE — Telephone Encounter (Signed)
No answer, No DPR for [facility].  Attempted to contact patient to set up 48 hour holer monitor.

## 2018-10-21 ENCOUNTER — Ambulatory Visit: Payer: 59 | Admitting: Family Medicine

## 2018-10-29 NOTE — Telephone Encounter (Signed)
Sherri Perry, can we reach out from our office to have her contact Cardiology as they have been trying to reach her?

## 2018-10-29 NOTE — Telephone Encounter (Signed)
No answer, no DPR for [facility] to leave message.  Attempted to contact patient to set up holter monitor.

## 2018-10-30 NOTE — Telephone Encounter (Signed)
Left detailed message on VM of Cardiology was trying to reach patient to schedule holter monitor for palpitations. Number given [phone]

## 2018-11-02 ENCOUNTER — Ambulatory Visit
Admission: RE | Admit: 2018-11-02 | Discharge: 2018-11-02 | Disposition: A | Discharge status: Home / Self Care | Payer: 59 | Source: Ambulatory Visit | Attending: Family Medicine | Admitting: Family Medicine

## 2018-11-02 ENCOUNTER — Other Ambulatory Visit: Payer: Self-pay

## 2018-11-02 DIAGNOSIS — Z1231 Encounter for screening mammogram for malignant neoplasm of breast: Secondary | ICD-10-CM

## 2018-11-04 NOTE — Telephone Encounter (Signed)
3 day ZIO XT long term holter monitor to be mailed to patients home.  Instructions reviewed briefly as they are included in the monitor kit.  Patient aware Raiford Noble, PA-C has requested she wear the monitor at least 48 hours.

## 2018-11-04 NOTE — Telephone Encounter (Signed)
Follow Up   Patient is calling back in to get the monitor sent out to her, Best contact number is [phone]. Please give patient a call back.

## 2018-11-10 ENCOUNTER — Ambulatory Visit (INDEPENDENT_AMBULATORY_CARE_PROVIDER_SITE_OTHER): Payer: 59

## 2018-11-10 DIAGNOSIS — R002 Palpitations: Secondary | ICD-10-CM | POA: Diagnosis not present

## 2018-11-24 NOTE — Progress Notes (Deleted)
Office Visit Note  Patient: Sherri Perry             Date of Birth: 1965/07/29           MRN: [id number]             PCP: Delorse Limber Referring: Delorse Limber Visit Date: 12/03/2018 Occupation: @GUAROCC @  Subjective:  No chief complaint on file.   History of Present Illness: Sherri Perry is a 53 y.o. female ***   Activities of Daily Living:  Patient reports morning stiffness for *** {minute/hour:19697}.   Patient {ACTIONS;DENIES/REPORTS:21021675::"Denies"} nocturnal pain.  Difficulty dressing/grooming: {ACTIONS;DENIES/REPORTS:21021675::"Denies"} Difficulty climbing stairs: {ACTIONS;DENIES/REPORTS:21021675::"Denies"} Difficulty getting out of chair: {ACTIONS;DENIES/REPORTS:21021675::"Denies"} Difficulty using hands for taps, buttons, cutlery, and/or writing: {ACTIONS;DENIES/REPORTS:21021675::"Denies"}  No Rheumatology ROS completed.   PMFS History:  Patient Active Problem List   Diagnosis Date Noted  . Bunion 11/15/2016    Past Medical History:  Diagnosis Date  . Anxiety   . Depression   . GERD (gastroesophageal reflux disease)   . Hyperlipemia   . RLS (restless legs syndrome)   . Sleep apnea    mild, no CPAP needed  . Snores     Family History  Problem Relation Age of Onset  . Cancer Mother        Breast  . Depression Mother   . Irritable bowel syndrome Mother   . Heart disease Father   . Depression Sister   . Heart disease Brother   . Heart disease Brother    Past Surgical History:  Procedure Laterality Date  . ABDOMINAL HYSTERECTOMY  4/15  . CARPAL TUNNEL RELEASE     right and left  . SHOULDER ARTHROSCOPY Right 02/14/2014   Procedure: ARTHROSCOPY SHOULDER;  Surgeon: Nita Sells, MD;  Location: [place];  Service: Orthopedics;  Laterality: Right;  Right shoulder arthroscopy debridement calcific tendonitis, subacromial decompression,   . SHOULDER ARTHROSCOPY Left 05/29/2015   Procedure:  ARTHROSCOPY SHOULDER;  Surgeon: Tania Ade, MD;  Location: [place];  Service: Orthopedics;  Laterality: Left;   Social History   Social History Narrative  . Not on file   Immunization History  Administered Date(s) Administered  . Tdap 04/08/2013     Objective: Vital Signs: There were no vitals taken for this visit.   Physical Exam   Musculoskeletal Exam: ***  CDAI Exam: CDAI Score: - Patient Global: -; Provider Global: - Swollen: -; Tender: - Joint Exam   No joint exam has been documented for this visit   There is currently no information documented on the homunculus. Go to the Rheumatology activity and complete the homunculus joint exam.  Investigation: Findings:  10/14/18:RF<14, CCP<16, sed rate 23, TSH 1.12  Component     Latest Ref Rng & Units 10/14/2018  RA Latex Turbid.     <81 IU/mL <85  Cyclic Citrullin Peptide Ab     UNITS <16  Sed Rate     0 - 30 mm/hr 23  TSH     0.35 - 4.50 uIU/mL 1.12   Imaging: Mm 3d Screen Breast Bilateral  Result Date: 11/03/2018 CLINICAL DATA:  Screening. EXAM: DIGITAL SCREENING BILATERAL MAMMOGRAM WITH TOMO AND CAD COMPARISON:  Previous exam(s). ACR Breast Density Category c: The breast tissue is heterogeneously dense, which may obscure small masses. FINDINGS: There are no findings suspicious for malignancy. Images were processed with CAD. IMPRESSION: No mammographic evidence of malignancy. A result letter of this screening mammogram will be mailed  directly to the patient. RECOMMENDATION: Screening mammogram in one year. (Code:SM-B-01Y) BI-RADS CATEGORY  1: Negative. Electronically Signed   By: Lovey Newcomer M.D.   On: 11/03/2018 09:07    Recent Labs: Lab Results  Component Value Date   WBC 5.9 10/14/2018   HGB 13.9 10/14/2018   PLT 273.0 10/14/2018    Speciality Comments: No specialty comments available.  Procedures:  No procedures performed Allergies: Patient has no known allergies.   Assessment /  Plan:     Visit Diagnoses: No diagnosis found.  Orders: No orders of the defined types were placed in this encounter.  No orders of the defined types were placed in this encounter.   Face-to-face time spent with patient was *** minutes. Greater than 50% of time was spent in counseling and coordination of care.  Follow-Up Instructions: No follow-ups on file.   Ofilia Neas, PA-C  Note - This record has been created using Dragon software.  Chart creation errors have been sought, but may not always  have been located. Such creation errors do not reflect on  the standard of medical care.

## 2018-11-30 ENCOUNTER — Telehealth: Payer: Self-pay | Admitting: Physician Assistant

## 2018-11-30 NOTE — Telephone Encounter (Signed)
LM for husband to call back to schedule an appt.      Copied from [place] [phone]. Topic: Appointment Scheduling - Scheduling Inquiry for Clinic >> Nov 27, 2018  5:26 PM Sheran Luz wrote: Patient's husband calling to schedule patient an exam before entering a residential treatment center for eating disorders.

## 2018-12-01 ENCOUNTER — Encounter: Payer: Self-pay | Admitting: Physician Assistant

## 2018-12-01 ENCOUNTER — Other Ambulatory Visit: Payer: Self-pay

## 2018-12-01 ENCOUNTER — Ambulatory Visit (INDEPENDENT_AMBULATORY_CARE_PROVIDER_SITE_OTHER): Payer: 59 | Admitting: Physician Assistant

## 2018-12-01 VITALS — BP 130/82 | HR 85 | Temp 98.5°F | Resp 16 | Ht 63.0 in | Wt 200.6 lb

## 2018-12-01 DIAGNOSIS — Z0000 Encounter for general adult medical examination without abnormal findings: Secondary | ICD-10-CM | POA: Diagnosis not present

## 2018-12-01 DIAGNOSIS — Z23 Encounter for immunization: Secondary | ICD-10-CM | POA: Diagnosis not present

## 2018-12-01 DIAGNOSIS — Z008 Encounter for other general examination: Secondary | ICD-10-CM

## 2018-12-01 NOTE — Progress Notes (Addendum)
Patient presents to clinic today for annual exam.  Patient is fasting for labs. Patient also for clearance into [facility] [place]. Has remote history of bulimia nervosa (25 years off/on) and more recent history of binge eating disorder. Is followed by Psychiatry, currently on a regimen of Trintellix 20 mg and Trazodone 50 mg QPM.  Health Maintenance: Immunizations -- Agrees to flu shot today..  Past Medical History:  Diagnosis Date  . Anxiety   . Depression   . GERD (gastroesophageal reflux disease)   . Hyperlipemia   . RLS (restless legs syndrome)   . Sleep apnea    mild, no CPAP needed  . Snores     Past Surgical History:  Procedure Laterality Date  . ABDOMINAL HYSTERECTOMY  4/15  . CARPAL TUNNEL RELEASE     right and left  . SHOULDER ARTHROSCOPY Right 02/14/2014   Procedure: ARTHROSCOPY SHOULDER;  Surgeon: Nita Sells, MD;  Location: [facility];  Service: Orthopedics;  Laterality: Right;  Right shoulder arthroscopy debridement calcific tendonitis, subacromial decompression,   . SHOULDER ARTHROSCOPY Left 05/29/2015   Procedure: ARTHROSCOPY SHOULDER;  Surgeon: Tania Ade, MD;  Location: [facility];  Service: Orthopedics;  Laterality: Left;    Current Outpatient Medications on File Prior to Visit  Medication Sig Dispense Refill  . estradiol (ESTRACE) 0.1 MG/GM vaginal cream Place 1 Applicatorful vaginally 3 (three) times a week.    . pantoprazole (PROTONIX) 40 MG tablet Take 1 tablet (40 mg total) by mouth daily. 30 tablet 3  . traZODone (DESYREL) 50 MG tablet Take 50 mg by mouth at bedtime.  4  . vortioxetine HBr (TRINTELLIX) 20 MG TABS tablet Take 20 mg by mouth daily.     No current facility-administered medications on file prior to visit.     No Known Allergies  Family History  Problem Relation Age of Onset  . Cancer Mother        Breast  . Depression Mother   . Irritable bowel syndrome Mother   .  Heart disease Father   . Depression Sister   . Heart disease Brother   . Heart disease Brother     Social History   Socioeconomic History  . Marital status: Married    Spouse name: Not on file  . Number of children: Not on file  . Years of education: Not on file  . Highest education level: Not on file  Occupational History  . Not on file  Social Needs  . Financial resource strain: Not on file  . Food insecurity    Worry: Not on file    Inability: Not on file  . Transportation needs    Medical: Not on file    Non-medical: Not on file  Tobacco Use  . Smoking status: Former Smoker    Quit date: 02/11/1994    Years since quitting: 24.8  . Smokeless tobacco: Never Used  Substance and Sexual Activity  . Alcohol use: Yes    Comment: occasional  . Drug use: No  . Sexual activity: Yes  Lifestyle  . Physical activity    Days per week: Not on file    Minutes per session: Not on file  . Stress: Not on file  Relationships  . Social Herbalist on phone: Not on file    Gets together: Not on file    Attends religious service: Not on file    Active member of club  or organization: Not on file    Attends meetings of clubs or organizations: Not on file    Relationship status: Not on file  . Intimate partner violence    Fear of current or ex partner: Not on file    Emotionally abused: Not on file    Physically abused: Not on file    Forced sexual activity: Not on file  Other Topics Concern  . Not on file  Social History Narrative  . Not on file   Review of Systems  Constitutional: Negative for fever and weight loss.  HENT: Negative for ear discharge, ear pain, hearing loss and tinnitus.   Eyes: Negative for blurred vision, double vision, photophobia and pain.  Respiratory: Negative for cough and shortness of breath.   Cardiovascular: Negative for chest pain and palpitations.  Gastrointestinal: Negative for abdominal pain, blood in stool, constipation, diarrhea,  heartburn, melena, nausea and vomiting.  Genitourinary: Negative for dysuria, flank pain, frequency, hematuria and urgency.  Musculoskeletal: Negative for falls.  Neurological: Negative for dizziness, loss of consciousness and headaches.  Endo/Heme/Allergies: Negative for environmental allergies.  Psychiatric/Behavioral: Positive for depression. Negative for hallucinations, memory loss, substance abuse and suicidal ideas. The patient is nervous/anxious. The patient does not have insomnia.    BP 130/82   Pulse 85   Temp 98.5 F (36.9 C) (Skin)   Resp 16   Ht 5\' 3"  (1.6 m)   Wt 200 lb 9.6 oz (91 kg)   SpO2 97%   BMI 35.53 kg/m   Physical Exam Vitals signs reviewed.  Constitutional:      Appearance: Normal appearance.  HENT:     Head: Normocephalic and atraumatic.     Right Ear: Tympanic membrane normal.     Left Ear: Tympanic membrane normal.     Nose: Nose normal.     Mouth/Throat:     Mouth: Mucous membranes are moist.  Eyes:     Conjunctiva/sclera: Conjunctivae normal.     Pupils: Pupils are equal, round, and reactive to light.  Neck:     Musculoskeletal: Neck supple.  Cardiovascular:     Rate and Rhythm: Normal rate and regular rhythm.     Pulses: Normal pulses.     Heart sounds: Normal heart sounds.  Pulmonary:     Effort: Pulmonary effort is normal.     Breath sounds: Normal breath sounds.  Abdominal:     General: Bowel sounds are normal. There is no distension.     Palpations: Abdomen is soft.     Tenderness: There is no abdominal tenderness.  Neurological:     General: No focal deficit present.     Mental Status: She is alert and oriented to person, place, and time.  Psychiatric:        Mood and Affect: Mood normal.     Recent Results (from the past 2160 hour(s))  Rheumatoid Factor     Status: None   Collection Time: 10/14/18 11:56 AM  Result Value Ref Range   Rhuematoid fact SerPl-aCnc 99991111 [id number] IU/mL  Cyclic citrul peptide antibody, IgG ([facility])      Status: None   Collection Time: 10/14/18 11:56 AM  Result Value Ref Range   Cyclic Citrullin Peptide Ab <16 UNITS    Comment: Reference Range Negative:            <20 Weak Positive:       20-39 Moderate Positive:   40-59 Strong Positive:     >59 .   Sedimentation rate  Status: None   Collection Time: 10/14/18 11:56 AM  Result Value Ref Range   Sed Rate 23 0 - 30 mm/hr  TSH     Status: None   Collection Time: 10/14/18 11:56 AM  Result Value Ref Range   TSH 1.12 0.35 - 4.50 uIU/mL  CBC w/Diff     Status: None   Collection Time: 10/14/18 11:56 AM  Result Value Ref Range   WBC 5.9 4.0 - 10.5 K/uL   RBC 4.56 3.87 - 5.11 Mil/uL   Hemoglobin 13.9 12.0 - 15.0 g/dL   HCT 40.7 36.0 - 46.0 %   MCV 89.1 78.0 - 100.0 fl   MCHC 34.3 30.0 - 36.0 g/dL   RDW 12.8 11.5 - 15.5 %   Platelets 273.0 150.0 - 400.0 K/uL   Neutrophils Relative % 54.1 43.0 - 77.0 %   Lymphocytes Relative 39.1 12.0 - 46.0 %   Monocytes Relative 5.0 3.0 - 12.0 %   Eosinophils Relative 1.1 0.0 - 5.0 %   Basophils Relative 0.7 0.0 - 3.0 %   Neutro Abs 3.2 1.4 - 7.7 K/uL   Lymphs Abs 2.3 0.7 - 4.0 K/uL   Monocytes Absolute 0.3 0.1 - 1.0 K/uL   Eosinophils Absolute 0.1 0.0 - 0.7 K/uL   Basophils Absolute 0.0 0.0 - 0.1 K/uL    Assessment/Plan: 1. Visit for preventive health examination Depression screen negative. Health Maintenance reviewed. Preventive schedule discussed and handout given in AVS. Will obtain fasting labs today.  - Hemoglobin A1c - Lipid Profile - EKG 12-Lead  2. Medical clearance for psychiatric admission Forms to be completed. Labs ordered including specific labs required by facility.  EKG obtained revealing NSR with rate of 78 bpm. - CBC with Differential/Platelet - Comprehensive metabolic panel - Lipase - Amylase - Magnesium - Phosphorus - TSH - Hemoglobin A1c - Urinalysis, Routine w reflex microscopic - Pain Mgmt, Profile 8 w/Conf, U - QuantiFERON-TB Gold Plus - EKG 12-Lead   3. Need for immunization against influenza Flu shot updated today. - Flu Vaccine QUAD 36+ mos IM   Leeanne Rio, PA-C

## 2018-12-01 NOTE — Patient Instructions (Signed)
Please go to the lab for blood work.   Our office will call you with your results unless you have chosen to receive results via MyChart.  If your blood work is normal we will follow-up each year for physicals and as scheduled for chronic medical problems.  If anything is abnormal we will treat accordingly and get you in for a follow-up.  I will let you know once forms are completed for you and sent in.   Preventive Care 36-52 Years Old, Female Preventive care refers to visits with your health care provider and lifestyle choices that can promote health and wellness. This includes:  A yearly physical exam. This may also be called an annual well check.  Regular dental visits and eye exams.  Immunizations.  Screening for certain conditions.  Healthy lifestyle choices, such as eating a healthy diet, getting regular exercise, not using drugs or products that contain nicotine and tobacco, and limiting alcohol use. What can I expect for my preventive care visit? Physical exam Your health care provider will check your:  Height and weight. This may be used to calculate body mass index (BMI), which tells if you are at a healthy weight.  Heart rate and blood pressure.  Skin for abnormal spots. Counseling Your health care provider may ask you questions about your:  Alcohol, tobacco, and drug use.  Emotional well-being.  Home and relationship well-being.  Sexual activity.  Eating habits.  Work and work Statistician.  Method of birth control.  Menstrual cycle.  Pregnancy history. What immunizations do I need?  Influenza (flu) vaccine  This is recommended every year. Tetanus, diphtheria, and pertussis (Tdap) vaccine  You may need a Td booster every 10 years. Varicella (chickenpox) vaccine  You may need this if you have not been vaccinated. Zoster (shingles) vaccine  You may need this after age 28. Measles, mumps, and rubella (MMR) vaccine  You may need at least one  dose of MMR if you were born in 1957 or later. You may also need a second dose. Pneumococcal conjugate (PCV13) vaccine  You may need this if you have certain conditions and were not previously vaccinated. Pneumococcal polysaccharide (PPSV23) vaccine  You may need one or two doses if you smoke cigarettes or if you have certain conditions. Meningococcal conjugate (MenACWY) vaccine  You may need this if you have certain conditions. Hepatitis A vaccine  You may need this if you have certain conditions or if you travel or work in places where you may be exposed to hepatitis A. Hepatitis B vaccine  You may need this if you have certain conditions or if you travel or work in places where you may be exposed to hepatitis B. Haemophilus influenzae type b (Hib) vaccine  You may need this if you have certain conditions. Human papillomavirus (HPV) vaccine  If recommended by your health care provider, you may need three doses over 6 months. You may receive vaccines as individual doses or as more than one vaccine together in one shot (combination vaccines). Talk with your health care provider about the risks and benefits of combination vaccines. What tests do I need? Blood tests  Lipid and cholesterol levels. These may be checked every 5 years, or more frequently if you are over 70 years old.  Hepatitis C test.  Hepatitis B test. Screening  Lung cancer screening. You may have this screening every year starting at age 60 if you have a 30-pack-year history of smoking and currently smoke or have quit within  the past 15 years.  Colorectal cancer screening. All adults should have this screening starting at age 28 and continuing until age 89. Your health care provider may recommend screening at age 24 if you are at increased risk. You will have tests every 1-10 years, depending on your results and the type of screening test.  Diabetes screening. This is done by checking your blood sugar (glucose)  after you have not eaten for a while (fasting). You may have this done every 1-3 years.  Mammogram. This may be done every 1-2 years. Talk with your health care provider about when you should start having regular mammograms. This may depend on whether you have a family history of breast cancer.  BRCA-related cancer screening. This may be done if you have a family history of breast, ovarian, tubal, or peritoneal cancers.  Pelvic exam and Pap test. This may be done every 3 years starting at age 47. Starting at age 3, this may be done every 5 years if you have a Pap test in combination with an HPV test. Other tests  Sexually transmitted disease (STD) testing.  Bone density scan. This is done to screen for osteoporosis. You may have this scan if you are at high risk for osteoporosis. Follow these instructions at home: Eating and drinking  Eat a diet that includes fresh fruits and vegetables, whole grains, lean protein, and low-fat dairy.  Take vitamin and mineral supplements as recommended by your health care provider.  Do not drink alcohol if: ? Your health care provider tells you not to drink. ? You are pregnant, may be pregnant, or are planning to become pregnant.  If you drink alcohol: ? Limit how much you have to 0-1 drink a day. ? Be aware of how much alcohol is in your drink. In the [place] one drink equals one 12 oz bottle of beer (355 mL), one 5 oz glass of wine (148 mL), or one 1 oz glass of hard liquor (44 mL). Lifestyle  Take daily care of your teeth and gums.  Stay active. Exercise for at least 30 minutes on 5 or more days each week.  Do not use any products that contain nicotine or tobacco, such as cigarettes, e-cigarettes, and chewing tobacco. If you need help quitting, ask your health care provider.  If you are sexually active, practice safe sex. Use a condom or other form of birth control (contraception) in order to prevent pregnancy and STIs (sexually transmitted  infections).  If told by your health care provider, take low-dose aspirin daily starting at age 51. What's next?  Visit your health care provider once a year for a well check visit.  Ask your health care provider how often you should have your eyes and teeth checked.  Stay up to date on all vaccines. This information is not intended to replace advice given to you by your health care provider. Make sure you discuss any questions you have with your health care provider. Document Released: 04/21/2015 Document Revised: 12/04/2017 Document Reviewed: 12/04/2017 Elsevier Patient Education  2020 [employer].

## 2018-12-02 LAB — URINALYSIS, ROUTINE W REFLEX MICROSCOPIC
Bilirubin Urine: NEGATIVE
Ketones, ur: NEGATIVE
Leukocytes,Ua: NEGATIVE
Nitrite: NEGATIVE
Specific Gravity, Urine: >=1.03 — AB (ref 1.000–1.030)
Total Protein, Urine: NEGATIVE
Urine Glucose: NEGATIVE
Urobilinogen, UA: 0.2 (ref 0.0–1.0)
pH: 5.5 (ref 5.0–8.0)

## 2018-12-02 LAB — MAGNESIUM: Magnesium: 2 mg/dL (ref 1.5–2.5)

## 2018-12-02 LAB — COMPREHENSIVE METABOLIC PANEL
ALT: 57 U/L — ABNORMAL HIGH (ref 0–35)
AST: 29 U/L (ref 0–37)
Albumin: 4.5 g/dL (ref 3.5–5.2)
Alkaline Phosphatase: 83 U/L (ref 39–117)
BUN: 22 mg/dL (ref 6–23)
CO2: 28 mEq/L (ref 19–32)
Calcium: 10 mg/dL (ref 8.4–10.5)
Chloride: 101 mEq/L (ref 96–112)
Creatinine, Ser: 0.69 mg/dL (ref 0.40–1.20)
GFR: 89.21 mL/min (ref >60.00)
Glucose, Bld: 85 mg/dL (ref 70–99)
Potassium: 4.4 mEq/L (ref 3.5–5.1)
Sodium: 139 mEq/L (ref 135–145)
Total Bilirubin: 0.3 mg/dL (ref 0.2–1.2)
Total Protein: 7.1 g/dL (ref 6.0–8.3)

## 2018-12-02 LAB — CBC WITH DIFFERENTIAL/PLATELET
Basophils Absolute: 0 10*3/uL (ref 0.0–0.1)
Basophils Relative: 0.6 % (ref 0.0–3.0)
Eosinophils Absolute: 0.1 10*3/uL (ref 0.0–0.7)
Eosinophils Relative: 1.3 % (ref 0.0–5.0)
HCT: 39.4 % (ref 36.0–46.0)
Hemoglobin: 13.4 g/dL (ref 12.0–15.0)
Lymphocytes Relative: 37.9 % (ref 12.0–46.0)
Lymphs Abs: 2.6 10*3/uL (ref 0.7–4.0)
MCHC: 34.1 g/dL (ref 30.0–36.0)
MCV: 89.1 fl (ref 78.0–100.0)
Monocytes Absolute: 0.4 10*3/uL (ref 0.1–1.0)
Monocytes Relative: 6.1 % (ref 3.0–12.0)
Neutro Abs: 3.7 10*3/uL (ref 1.4–7.7)
Neutrophils Relative %: 54.1 % (ref 43.0–77.0)
Platelets: 263 10*3/uL (ref 150.0–400.0)
RBC: 4.42 Mil/uL (ref 3.87–5.11)
RDW: 13.3 % (ref 11.5–15.5)
WBC: 6.8 10*3/uL (ref 4.0–10.5)

## 2018-12-02 LAB — LIPID PANEL
Cholesterol: 347 mg/dL — ABNORMAL HIGH (ref 0–200)
HDL: 53.9 mg/dL (ref >39.00)
Total CHOL/HDL Ratio: 6
Triglycerides: 526 mg/dL — ABNORMAL HIGH (ref 0.0–149.0)

## 2018-12-02 LAB — LDL CHOLESTEROL, DIRECT: Direct LDL: 223 mg/dL

## 2018-12-02 LAB — HEMOGLOBIN A1C: Hgb A1c MFr Bld: 5.4 % (ref 4.6–6.5)

## 2018-12-02 LAB — TSH: TSH: 1 u[IU]/mL (ref 0.35–4.50)

## 2018-12-02 LAB — AMYLASE: Amylase: 47 U/L (ref 27–131)

## 2018-12-02 LAB — PHOSPHORUS: Phosphorus: 4.8 mg/dL — ABNORMAL HIGH (ref 2.3–4.6)

## 2018-12-02 LAB — LIPASE: Lipase: 39 U/L (ref 11.0–59.0)

## 2018-12-03 ENCOUNTER — Ambulatory Visit: Payer: 59 | Admitting: Rheumatology

## 2018-12-04 LAB — PAIN MGMT, PROFILE 8 W/CONF, U
6 Acetylmorphine: NEGATIVE ng/mL
Alcohol Metabolites: NEGATIVE ng/mL (ref <500)
Alphahydroxyalprazolam: 80 ng/mL
Alphahydroxymidazolam: NEGATIVE ng/mL
Alphahydroxytriazolam: NEGATIVE ng/mL
Aminoclonazepam: NEGATIVE ng/mL
Amphetamines: NEGATIVE ng/mL
Benzodiazepines: POSITIVE ng/mL
Buprenorphine, Urine: NEGATIVE ng/mL
Cocaine Metabolite: NEGATIVE ng/mL
Creatinine: 85.3 mg/dL
Hydroxyethylflurazepam: NEGATIVE ng/mL
Lorazepam: NEGATIVE ng/mL
MDMA: NEGATIVE ng/mL
Marijuana Metabolite: NEGATIVE ng/mL
Nordiazepam: NEGATIVE ng/mL
Opiates: NEGATIVE ng/mL
Oxazepam: NEGATIVE ng/mL
Oxidant: NEGATIVE ug/mL
Oxycodone: NEGATIVE ng/mL
Temazepam: NEGATIVE ng/mL
pH: 5.3 (ref 4.5–9.0)

## 2018-12-04 LAB — QUANTIFERON-TB GOLD PLUS
Mitogen-NIL: >10 IU/mL
NIL: 0.02 IU/mL
QuantiFERON-TB Gold Plus: NEGATIVE
TB1-NIL: 0.01 IU/mL
TB2-NIL: 0 IU/mL

## 2018-12-08 ENCOUNTER — Other Ambulatory Visit: Payer: Self-pay

## 2018-12-08 DIAGNOSIS — E7800 Pure hypercholesterolemia, unspecified: Secondary | ICD-10-CM

## 2018-12-08 DIAGNOSIS — R748 Abnormal levels of other serum enzymes: Secondary | ICD-10-CM

## 2018-12-10 ENCOUNTER — Encounter: Payer: Self-pay | Admitting: Emergency Medicine

## 2018-12-11 ENCOUNTER — Other Ambulatory Visit: Payer: Self-pay | Admitting: Emergency Medicine

## 2018-12-11 DIAGNOSIS — E7800 Pure hypercholesterolemia, unspecified: Secondary | ICD-10-CM

## 2018-12-11 MED ORDER — PRAVASTATIN SODIUM 20 MG PO TABS: 20.0000 mg | ORAL_TABLET | Freq: Every day | ORAL | 2 refills | Status: DC

## 2018-12-16 NOTE — Progress Notes (Deleted)
Office Visit Note  Patient: Sherri Perry             Date of Birth: 03/16/1966           MRN: [id number]             PCP: Delorse Limber Referring: Brunetta Jeans, PA-C Visit Date: 12/30/2018 Occupation: @GUAROCC @  Subjective:  No chief complaint on file.   History of Present Illness: Sherri Perry is a 53 y.o. female ***   Activities of Daily Living:  Patient reports morning stiffness for *** {minute/hour:19697}.   Patient {ACTIONS;DENIES/REPORTS:21021675::"Denies"} nocturnal pain.  Difficulty dressing/grooming: {ACTIONS;DENIES/REPORTS:21021675::"Denies"} Difficulty climbing stairs: {ACTIONS;DENIES/REPORTS:21021675::"Denies"} Difficulty getting out of chair: {ACTIONS;DENIES/REPORTS:21021675::"Denies"} Difficulty using hands for taps, buttons, cutlery, and/or writing: {ACTIONS;DENIES/REPORTS:21021675::"Denies"}  No Rheumatology ROS completed.   PMFS History:  Patient Active Problem List   Diagnosis Date Noted  . Bunion 11/15/2016    Past Medical History:  Diagnosis Date  . Anxiety   . Depression   . GERD (gastroesophageal reflux disease)   . Hyperlipemia   . RLS (restless legs syndrome)   . Sleep apnea    mild, no CPAP needed  . Snores     Family History  Problem Relation Age of Onset  . Cancer Mother        Breast  . Depression Mother   . Irritable bowel syndrome Mother   . Heart disease Father   . Depression Sister   . Heart disease Brother   . Heart disease Brother    Past Surgical History:  Procedure Laterality Date  . ABDOMINAL HYSTERECTOMY  4/15  . CARPAL TUNNEL RELEASE     right and left  . SHOULDER ARTHROSCOPY Right 02/14/2014   Procedure: ARTHROSCOPY SHOULDER;  Surgeon: Nita Sells, MD;  Location: [facility];  Service: Orthopedics;  Laterality: Right;  Right shoulder arthroscopy debridement calcific tendonitis, subacromial decompression,   . SHOULDER ARTHROSCOPY Left 05/29/2015   Procedure:  ARTHROSCOPY SHOULDER;  Surgeon: Tania Ade, MD;  Location: [facility];  Service: Orthopedics;  Laterality: Left;   Social History   Social History Narrative  . Not on file   Immunization History  Administered Date(s) Administered  . Influenza,inj,Quad PF,6+ Mos 12/01/2018  . Tdap 04/08/2013     Objective: Vital Signs: There were no vitals taken for this visit.   Physical Exam   Musculoskeletal Exam: ***  CDAI Exam: CDAI Score: - Patient Global: -; Provider Global: - Swollen: -; Tender: - Joint Exam   No joint exam has been documented for this visit   There is currently no information documented on the homunculus. Go to the Rheumatology activity and complete the homunculus joint exam.  Investigation: No additional findings.  Imaging: No results found.  Recent Labs: Lab Results  Component Value Date   WBC 6.8 12/01/2018   HGB 13.4 12/01/2018   PLT 263.0 12/01/2018   NA 139 12/01/2018   K 4.4 12/01/2018   CL 101 12/01/2018   CO2 28 12/01/2018   GLUCOSE 85 12/01/2018   BUN 22 12/01/2018   CREATININE 0.69 12/01/2018   BILITOT 0.3 12/01/2018   ALKPHOS 83 12/01/2018   AST 29 12/01/2018   ALT 57 (H) 12/01/2018   PROT 7.1 12/01/2018   ALBUMIN 4.5 12/01/2018   CALCIUM 10.0 12/01/2018   QFTBGOLDPLUS NEGATIVE 12/01/2018    Speciality Comments: No specialty comments available.  Procedures:  No procedures performed Allergies: Patient has no known allergies.   Assessment / Plan:  Visit Diagnoses: No diagnosis found.  Orders: No orders of the defined types were placed in this encounter.  No orders of the defined types were placed in this encounter.   Face-to-face time spent with patient was *** minutes. Greater than 50% of time was spent in counseling and coordination of care.  Follow-Up Instructions: No follow-ups on file.   Bo Merino, MD  Note - This record has been created using Editor, commissioning.  Chart creation errors  have been sought, but may not always  have been located. Such creation errors do not reflect on  the standard of medical care.

## 2018-12-22 ENCOUNTER — Encounter: Payer: Self-pay | Admitting: Physician Assistant

## 2018-12-29 ENCOUNTER — Other Ambulatory Visit: Payer: Self-pay | Admitting: Physician Assistant

## 2018-12-29 DIAGNOSIS — K219 Gastro-esophageal reflux disease without esophagitis: Secondary | ICD-10-CM

## 2018-12-30 ENCOUNTER — Ambulatory Visit: Payer: 59 | Admitting: Rheumatology

## 2019-01-04 ENCOUNTER — Ambulatory Visit: Payer: 59

## 2019-02-03 ENCOUNTER — Encounter: Payer: Self-pay | Admitting: Physician Assistant

## 2019-03-09 ENCOUNTER — Encounter: Payer: Self-pay | Admitting: Physician Assistant

## 2019-03-09 ENCOUNTER — Other Ambulatory Visit: Payer: Self-pay

## 2019-03-09 ENCOUNTER — Ambulatory Visit (INDEPENDENT_AMBULATORY_CARE_PROVIDER_SITE_OTHER): Payer: 59 | Admitting: Physician Assistant

## 2019-03-09 DIAGNOSIS — E785 Hyperlipidemia, unspecified: Secondary | ICD-10-CM | POA: Diagnosis not present

## 2019-03-09 DIAGNOSIS — R05 Cough: Secondary | ICD-10-CM

## 2019-03-09 DIAGNOSIS — R053 Chronic cough: Secondary | ICD-10-CM

## 2019-03-09 NOTE — Patient Instructions (Signed)
Instructions sent to MyChart.    Preventing High Cholesterol Cholesterol is a white, waxy substance similar to fat that the human body needs to help build cells. The liver makes all the cholesterol that a person's body needs. Having high cholesterol (hypercholesterolemia) increases a person's risk for heart disease and stroke. Extra (excess) cholesterol comes from the food the person eats. High cholesterol can often be prevented with diet and lifestyle changes. If you already have high cholesterol, you can control it with diet and lifestyle changes and with medicine. How can high cholesterol affect me? If you have high cholesterol, deposits (plaques) may build up on the walls of your arteries. The arteries are the blood vessels that carry blood away from your heart. Plaques make the arteries narrower and stiffer. This can limit or block blood flow and cause blood clots to form. Blood clots:  Are tiny balls of cells that form in your blood.  Can move to the heart or brain, causing a heart attack or stroke. Plaques in arteries greatly increase your risk for heart attack and stroke.Making diet and lifestyle changes can reduce your risk for these conditions that may threaten your life. What can increase my risk? This condition is more likely to develop in people who:  Eat foods that are high in saturated fat or cholesterol. Saturated fat is mostly found in: ? Foods that contain animal fat, such as red meat and some dairy products. ? Certain fatty foods made from plants, such as tropical oils.  Are overweight.  Are not getting enough exercise.  Have a family history of high cholesterol. What actions can I take to prevent this? Nutrition   Eat less saturated fat.  Avoid trans fats (partially hydrogenated oils). These are often found in margarine and in some baked goods, fried foods, and snacks bought in packages.  Avoid precooked or cured meat, such as sausages or meat loaves.  Avoid  foods and drinks that have added sugars.  Eat more fruits, vegetables, and whole grains.  Choose healthy sources of protein, such as fish, poultry, lean cuts of red meat, beans, peas, lentils, and nuts.  Choose healthy sources of fat, such as: ? Nuts. ? Vegetable oils, especially olive oil. ? Fish that have healthy fats (omega-3 fatty acids), such as mackerel or salmon. The items listed above may not be a complete list of recommended foods and beverages. Contact a dietitian for more information. Lifestyle  Lose weight if you are overweight. Losing 5-10 lb (2.3-4.5 kg) can help prevent or control high cholesterol. It can also lower your risk for diabetes and high blood pressure. Ask your health care provider to help you with a diet and exercise plan to lose weight safely.  Do not use any products that contain nicotine or tobacco, such as cigarettes, e-cigarettes, and chewing tobacco. If you need help quitting, ask your health care provider.  Limit your alcohol intake. ? Do not drink alcohol if:  Your health care provider tells you not to drink.  You are pregnant, may be pregnant, or are planning to become pregnant. ? If you drink alcohol:  Limit how much you use to:  0-1 drink a day for women.  0-2 drinks a day for men.  Be aware of how much alcohol is in your drink. In the [place] one drink equals one 12 oz bottle of beer (355 mL), one 5 oz glass of wine (148 mL), or one 1 oz glass of hard liquor (44 mL). Activity  Get enough exercise. Each week, do at least 150 minutes of exercise that takes a medium level of effort (moderate-intensity exercise). ? This is exercise that:  Makes your heart beat faster and makes you breathe harder than usual.  Allows you to still be able to talk. ? You could exercise in short sessions several times a day or longer sessions a few times a week. For example, on 5 days each week, you could walk fast or ride your bike 3 times a day for 10 minutes  each time.  Do exercises as told by your health care provider. Medicines  In addition to diet and lifestyle changes, your health care provider may recommend medicines to help lower cholesterol. This may be a medicine to lower the amount of cholesterol your liver makes. You may need medicine if: ? Diet and lifestyle changes do not lower your cholesterol enough. ? You have high cholesterol and other risk factors for heart disease or stroke.  Take over-the-counter and prescription medicines only as told by your health care provider. General information  Manage your risk factors for high cholesterol. Talk with your health care provider about all your risk factors and how to lower your risk.  Manage other conditions that you have, such as diabetes or high blood pressure (hypertension).  Have blood tests to check your cholesterol levels at regular points in time as told by your health care provider.  Keep all follow-up visits as told by your health care provider. This is important. Where to find more information  American Heart Association: www.heart.org  National Heart, Lung, and Blood Institute: https://wilson-eaton.com/ Summary  High cholesterol increases your risk for heart disease and stroke. By keeping your cholesterol level low, you can reduce your risk for these conditions.  High cholesterol can often be prevented with diet and lifestyle changes.  Work with your health care provider to manage your risk factors, and have your blood tested regularly. This information is not intended to replace advice given to you by your health care provider. Make sure you discuss any questions you have with your health care provider. Document Released: 04/09/2015 Document Revised: 07/17/2018 Document Reviewed: 12/02/2015 Elsevier Patient Education  2020 [employer].

## 2019-03-09 NOTE — Progress Notes (Signed)
I have discussed the procedure for the virtual visit with the patient who has given consent to proceed with assessment and treatment.   Pt unable to obtain vitals.   Rilyn Scroggs L Isador Castille, CMA     

## 2019-03-09 NOTE — Progress Notes (Signed)
   Virtual Visit via Video   I connected with patient on 03/09/19 at  8:00 AM EST by a video enabled telemedicine application and verified that I am speaking with the correct person using two identifiers.  Location patient: Home Location provider: [facility] Persons participating in the virtual visit: Patient, Provider, [place] (Patina Moore)  I discussed the limitations of evaluation and management by telemedicine and the availability of in person appointments. The patient expressed understanding and agreed to proceed.  Subjective:   HPI:   Patient presents via telehealth portal to follow-up regarding cough and elevated cholesterol.   Patient endorses continued phlegm production with talking, eating, and laughing. When eating or laughing will develop "coughing fits". Ongoing for several months, occurs daily, has not noticed any changes in the cough. Not currently taking Protonix 40 mg, did not feel like it was helping. Has occasional reflux symptoms relieved with prn Protonix or Tums. No SOB, chest congestion, heartburn, nasal congestion, fever, chills. Occasional hoarseness with some allergy symptoms, improved with zyrtec.   Hyperlipidemia --  on pravastatin 20 mg QD and krill oil QD, taking as directed, tolerating well. Eating regularly, no binges.   ROS:   See pertinent positives and negatives per HPI.  Patient Active Problem List   Diagnosis Date Noted  . Bunion 11/15/2016    Social History   Tobacco Use  . Smoking status: Former Smoker    Quit date: 02/11/1994    Years since quitting: 25.0  . Smokeless tobacco: Never Used  Substance Use Topics  . Alcohol use: Yes    Comment: occasional    Current Outpatient Medications:  .  estradiol (ESTRACE) 0.1 MG/GM vaginal cream, Place 1 Applicatorful vaginally 3 (three) times a week., Disp: , Rfl:  .  pantoprazole (PROTONIX) 40 MG tablet, TAKE 1 TABLET BY MOUTH EVERY DAY, Disp: 90 tablet, Rfl: 1 .  pravastatin  (PRAVACHOL) 20 MG tablet, Take 1 tablet (20 mg total) by mouth daily., Disp: 30 tablet, Rfl: 2 .  traZODone (DESYREL) 50 MG tablet, Take 50 mg by mouth at bedtime., Disp: , Rfl: 4 .  vortioxetine HBr (TRINTELLIX) 20 MG TABS tablet, Take 20 mg by mouth daily., Disp: , Rfl:   No Known Allergies  Objective:   There were no vitals taken for this visit.  Patient is well-developed, well-nourished in no acute distress.  Resting comfortably at home.  Head is normocephalic, atraumatic.  No labored breathing.  Speech is clear and coherent with logical content.  Patient is alert and oriented at baseline.   Assessment and Plan:   1. Chronic cough Ongoing. No improvement with PPI. Does not sound infectious or related to reactive airway. Will refer to ENT for assessment and laryngoscopy - Ambulatory referral to ENT  2. Hyperlipidemia, unspecified hyperlipidemia type Taking medications as directed. Has successfully completed a program for binge eating disorder so diet is much improved. Lab appt scheduled for fasting labs.     Leeanne Rio, PA-C 03/09/2019

## 2019-03-11 ENCOUNTER — Ambulatory Visit (INDEPENDENT_AMBULATORY_CARE_PROVIDER_SITE_OTHER): Payer: 59

## 2019-03-11 ENCOUNTER — Other Ambulatory Visit: Payer: Self-pay

## 2019-03-11 DIAGNOSIS — E7800 Pure hypercholesterolemia, unspecified: Secondary | ICD-10-CM | POA: Diagnosis not present

## 2019-03-11 DIAGNOSIS — R748 Abnormal levels of other serum enzymes: Secondary | ICD-10-CM | POA: Diagnosis not present

## 2019-03-11 LAB — BASIC METABOLIC PANEL
BUN: 24 mg/dL — ABNORMAL HIGH (ref 6–23)
CO2: 26 mEq/L (ref 19–32)
Calcium: 9.5 mg/dL (ref 8.4–10.5)
Chloride: 102 mEq/L (ref 96–112)
Creatinine, Ser: 0.81 mg/dL (ref 0.40–1.20)
GFR: 74.07 mL/min (ref >60.00)
Glucose, Bld: 88 mg/dL (ref 70–99)
Potassium: 4.7 mEq/L (ref 3.5–5.1)
Sodium: 137 mEq/L (ref 135–145)

## 2019-03-11 LAB — LIPID PANEL
Cholesterol: 261 mg/dL — ABNORMAL HIGH (ref 0–200)
HDL: 51.7 mg/dL (ref >39.00)
NonHDL: 209.14
Total CHOL/HDL Ratio: 5
Triglycerides: 333 mg/dL — ABNORMAL HIGH (ref 0.0–149.0)
VLDL: 66.6 mg/dL — ABNORMAL HIGH (ref 0.0–40.0)

## 2019-03-11 LAB — LDL CHOLESTEROL, DIRECT: Direct LDL: 170 mg/dL

## 2019-03-11 LAB — HEPATIC FUNCTION PANEL
ALT: 50 U/L — ABNORMAL HIGH (ref 0–35)
AST: 26 U/L (ref 0–37)
Albumin: 4.3 g/dL (ref 3.5–5.2)
Alkaline Phosphatase: 94 U/L (ref 39–117)
Bilirubin, Direct: 0.1 mg/dL (ref 0.0–0.3)
Total Bilirubin: 0.4 mg/dL (ref 0.2–1.2)
Total Protein: 6.9 g/dL (ref 6.0–8.3)

## 2019-03-24 ENCOUNTER — Other Ambulatory Visit: Payer: Self-pay | Admitting: Emergency Medicine

## 2019-03-24 DIAGNOSIS — E7800 Pure hypercholesterolemia, unspecified: Secondary | ICD-10-CM

## 2019-03-24 MED ORDER — PRAVASTATIN SODIUM 40 MG PO TABS: 40.0000 mg | ORAL_TABLET | Freq: Every day | ORAL | 0 refills | Status: DC

## 2019-03-27 ENCOUNTER — Other Ambulatory Visit: Payer: Self-pay | Admitting: Physician Assistant

## 2019-03-27 DIAGNOSIS — E7800 Pure hypercholesterolemia, unspecified: Secondary | ICD-10-CM

## 2019-06-18 ENCOUNTER — Other Ambulatory Visit: Payer: Self-pay | Admitting: Physician Assistant

## 2019-06-18 DIAGNOSIS — E7800 Pure hypercholesterolemia, unspecified: Secondary | ICD-10-CM

## 2019-06-24 ENCOUNTER — Other Ambulatory Visit: Payer: Self-pay

## 2019-06-24 ENCOUNTER — Ambulatory Visit (INDEPENDENT_AMBULATORY_CARE_PROVIDER_SITE_OTHER): Payer: 59 | Admitting: Podiatry

## 2019-06-24 VITALS — Temp 98.3°F

## 2019-06-24 DIAGNOSIS — L600 Ingrowing nail: Secondary | ICD-10-CM | POA: Diagnosis not present

## 2019-06-24 DIAGNOSIS — M79675 Pain in left toe(s): Secondary | ICD-10-CM

## 2019-06-24 DIAGNOSIS — M79674 Pain in right toe(s): Secondary | ICD-10-CM

## 2019-06-24 MED ORDER — CEPHALEXIN 500 MG PO CAPS: 500.0000 mg | ORAL_CAPSULE | Freq: Three times a day (TID) | ORAL | 0 refills | Status: AC

## 2019-06-24 NOTE — Patient Instructions (Signed)

## 2019-06-27 NOTE — Progress Notes (Signed)
Subjective: 54 year old female presents the office today for concerns of ingrown to both of her big toenails on both the medial lateral nail borders.  This is been a chronic issue for her causing discomfort she was to go to proceed at the corners removed.  Currently denies any drainage or pus. Denies any systemic complaints such as fevers, chills, nausea, vomiting. No acute changes since last appointment, and no other complaints at this time.   Objective: AAO x3, NAD DP/PT pulses palpable bilaterally, CRT less than 3 seconds Incurvation present to both the medial lateral aspects of bilateral hallux toenails with tenderness palpation.  Minimal edema.  Is no erythema or warmth.  No drainage of pus or any other signs of infection.  No open lesions or pre-ulcerative lesions.  No pain with calf compression, swelling, warmth, erythema  Assessment: Ingrown toenails toenails bilateral hallux  Plan: -All treatment options discussed with the patient including all alternatives, risks, complications.  -At this time, the patient is requesting partial nail removal with chemical matricectomy to the symptomatic portion of the nail. Risks and complications were discussed with the patient for which they understand and written consent was obtained. Under sterile conditions a total of 3 mL of a mixture of 2% lidocaine plain and 0.5% Marcaine plain was infiltrated in a hallux block fashion. Once anesthetized, the skin was prepped in sterile fashion. A tourniquet was then applied. Next the medial/lateral aspect of hallux nail border was then sharply excised making sure to remove the entire offending nail border. Once the nails were ensured to be removed area was debrided and the underlying skin was intact. There is no purulence identified in the procedure. Next phenol was then applied under standard conditions and copiously irrigated. Silvadene was applied. A dry sterile dressing was applied. After application of the  dressing the tourniquet was removed and there is found to be an immediate capillary refill time to the digit. The patient tolerated the procedure well any complications. Post procedure instructions were discussed the patient for which he verbally understood. Follow-up in one week for nail check or sooner if any problems are to arise. Discussed signs/symptoms of infection and directed to call the office immediately should any occur or go directly to the emergency room. In the meantime, encouraged to call the office with any questions, concerns, changes symptoms. -Keflex -Patient encouraged to call the office with any questions, concerns, change in symptoms.    Return for nail check in 1-2 weeks.  Trula Slade DPM

## 2019-07-01 ENCOUNTER — Ambulatory Visit: Payer: 59 | Attending: Internal Medicine

## 2019-07-01 DIAGNOSIS — Z23 Encounter for immunization: Secondary | ICD-10-CM

## 2019-07-01 NOTE — Progress Notes (Signed)
   Covid-19 Vaccination Clinic  Name:  [name]    MRN: [id number] DOB: [birth year]  07/01/2019  Ms. [name] was observed post Covid-19 immunization for 15 minutes without incident. She was provided with Vaccine Information Sheet and instruction to access the V-Safe system.   Ms. Grapes was instructed to call 911 with any severe reactions post vaccine: Marland Kitchen Difficulty breathing  . Swelling of face and throat  . A fast heartbeat  . A bad rash all over body  . Dizziness and weakness   Immunizations Administered    Name Date Dose VIS Date Route   Moderna COVID-19 Vaccine 07/01/2019 12:13 PM 0.5 mL 03/09/2019 Intramuscular   Manufacturer: Moderna   Lot: [id number]   [id number]

## 2019-07-12 ENCOUNTER — Ambulatory Visit: Payer: 59 | Admitting: Podiatry

## 2019-08-04 ENCOUNTER — Ambulatory Visit: Payer: 59 | Attending: Internal Medicine

## 2019-08-04 DIAGNOSIS — Z23 Encounter for immunization: Secondary | ICD-10-CM

## 2019-08-04 NOTE — Progress Notes (Signed)
   Covid-19 Vaccination Clinic  Name:  Sherri Perry    MRN: [id number] DOB: Apr 29, 1965  08/04/2019  Sherri Perry was observed post Covid-19 immunization for 15 minutes without incident. She was provided with Vaccine Information Sheet and instruction to access the V-Safe system.   Sherri Perry was instructed to call 911 with any severe reactions post vaccine: Marland Kitchen Difficulty breathing  . Swelling of face and throat  . A fast heartbeat  . A bad rash all over body  . Dizziness and weakness   Immunizations Administered    Name Date Dose VIS Date Route   Moderna COVID-19 Vaccine 08/04/2019  9:43 AM 0.5 mL 03/2019 Intramuscular   Manufacturer: Moderna   Lot: [id number]   [id number]

## 2019-10-26 ENCOUNTER — Telehealth: Payer: Self-pay | Admitting: Physician Assistant

## 2019-10-26 ENCOUNTER — Ambulatory Visit: Payer: 59 | Admitting: Physician Assistant

## 2019-10-26 NOTE — Telephone Encounter (Signed)
Sherri Perry, this pt canceled her 8:30am appt for today 7/20 due to her not leaving her house on time. She will call back to rsx

## 2019-11-16 ENCOUNTER — Other Ambulatory Visit: Payer: Self-pay | Admitting: Physician Assistant

## 2019-11-16 DIAGNOSIS — E7800 Pure hypercholesterolemia, unspecified: Secondary | ICD-10-CM

## 2019-12-12 ENCOUNTER — Other Ambulatory Visit: Payer: Self-pay | Admitting: Physician Assistant

## 2019-12-12 DIAGNOSIS — E7800 Pure hypercholesterolemia, unspecified: Secondary | ICD-10-CM

## 2019-12-12 IMAGING — US US EXTREM LOW*L* LIMITED
1 series · 14 of 20 positions shown · non-contrast
Comparison: None.

CLINICAL DATA: Tender soft tissue mass of the inner left thigh.

EXAM:
ULTRASOUND LEFT LOWER EXTREMITY LIMITED
TECHNIQUE: Ultrasound examination of the lower extremity soft tissues was
performed in the area of clinical concern.

[Series 1: us extrem low*left* limited · 20 acquisitions, 14 frames shown]
[im 1/20]
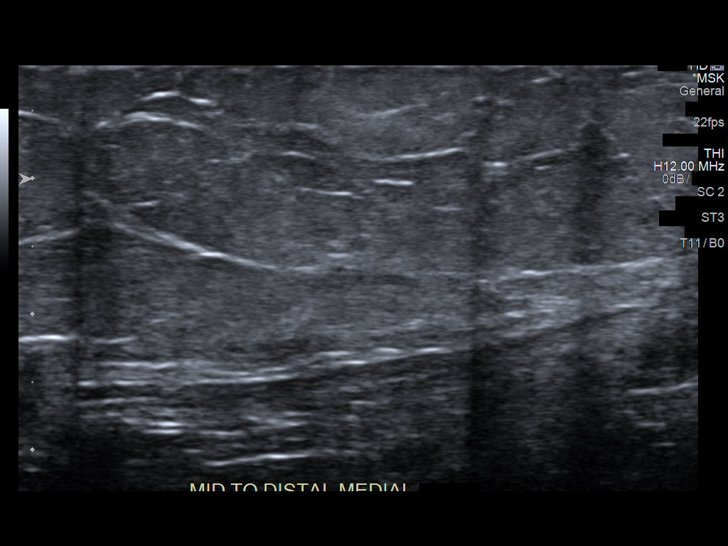
[im 3/20]
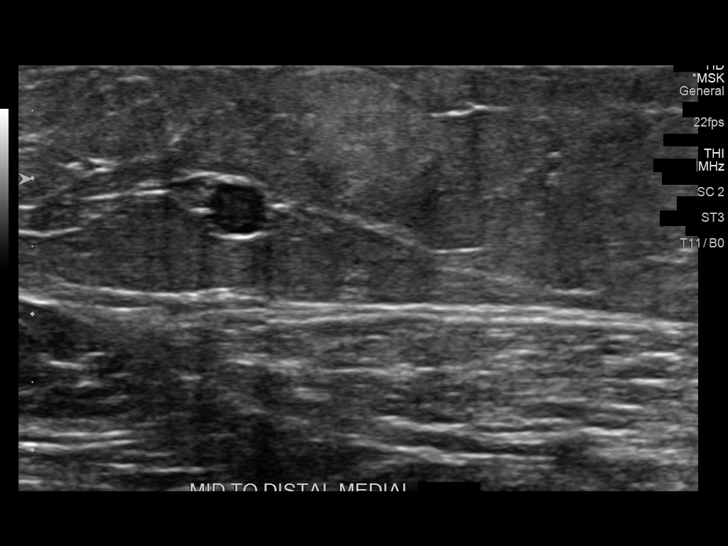
[im 4/20]
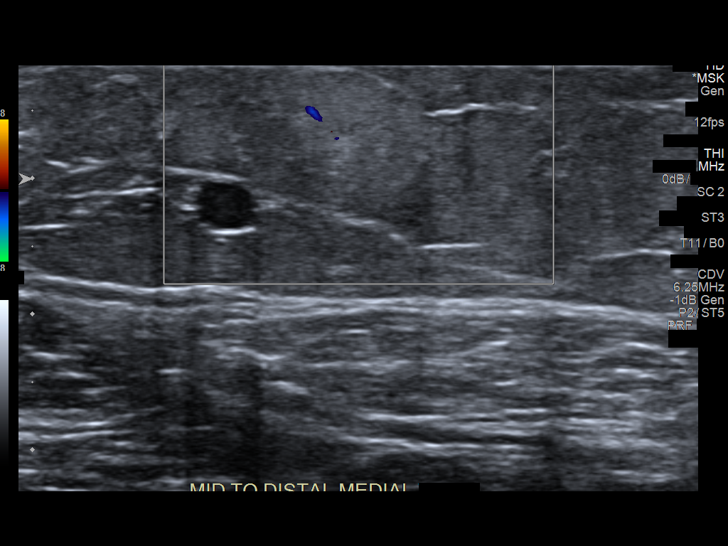
[im 6/20]
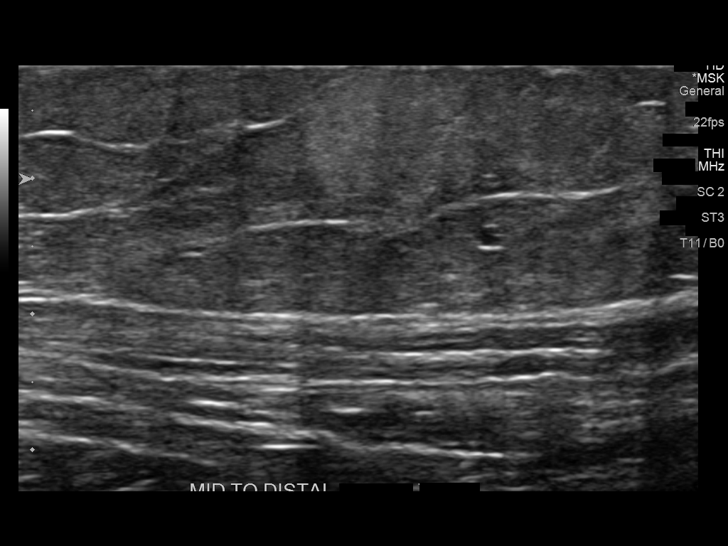
[im 7/20]
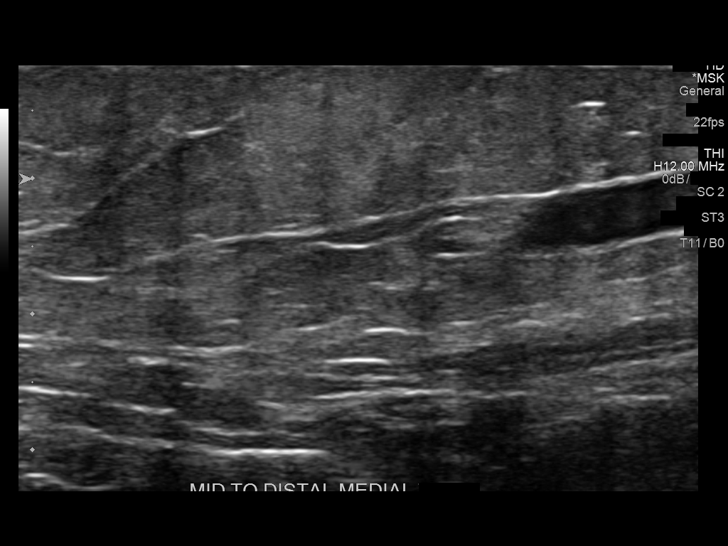
[im 8/20]
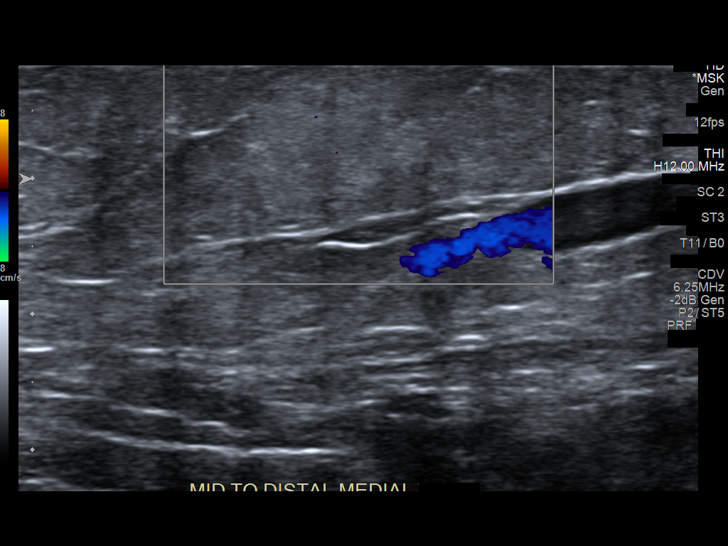
[im 10/20]
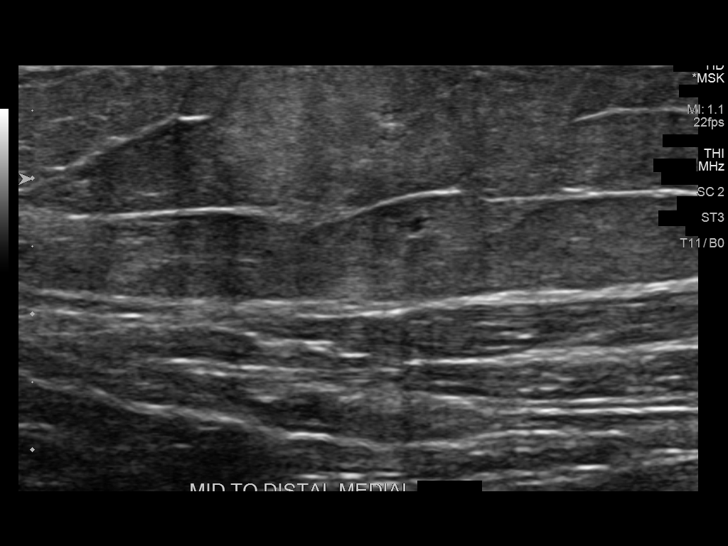
[im 11/20]
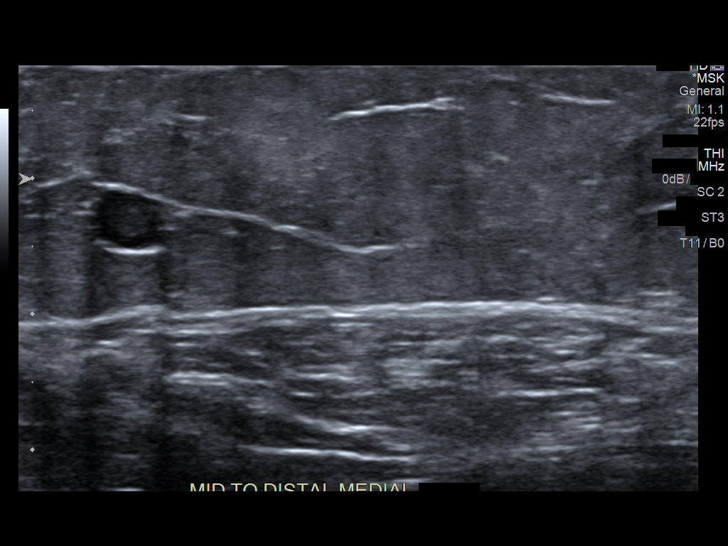
[im 13/20]
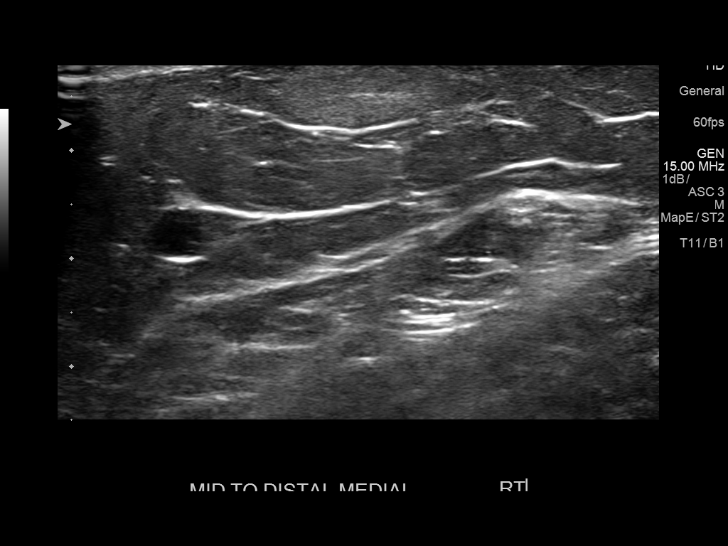
[im 14/20]
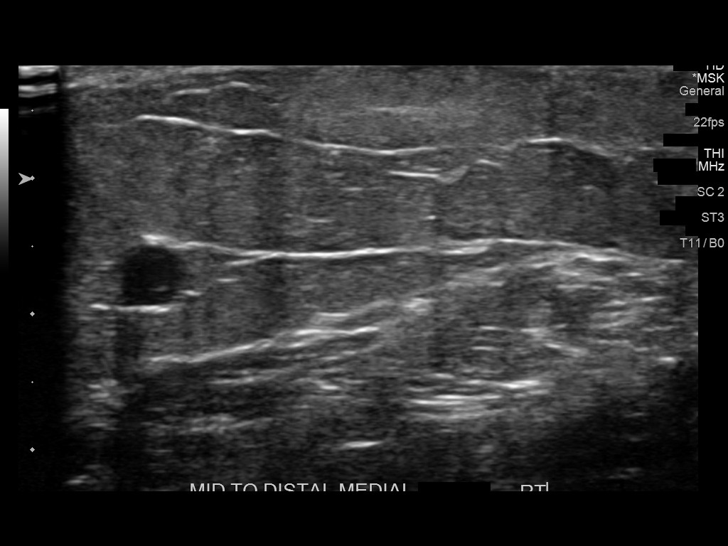
[im 16/20]
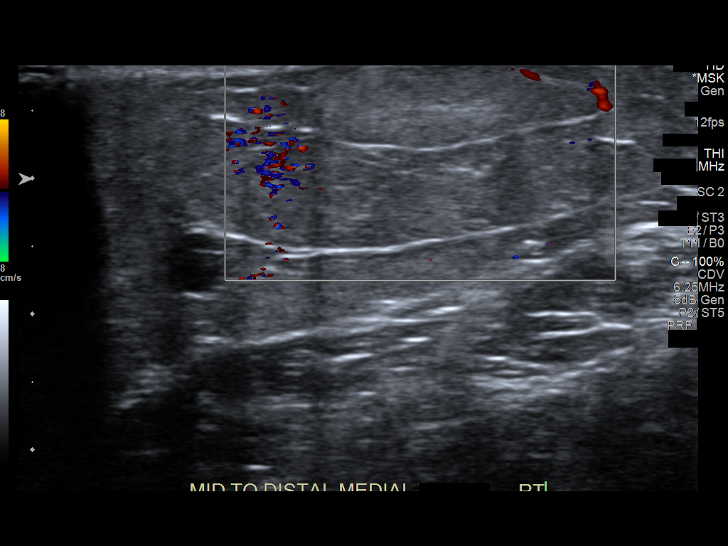
[im 17/20]
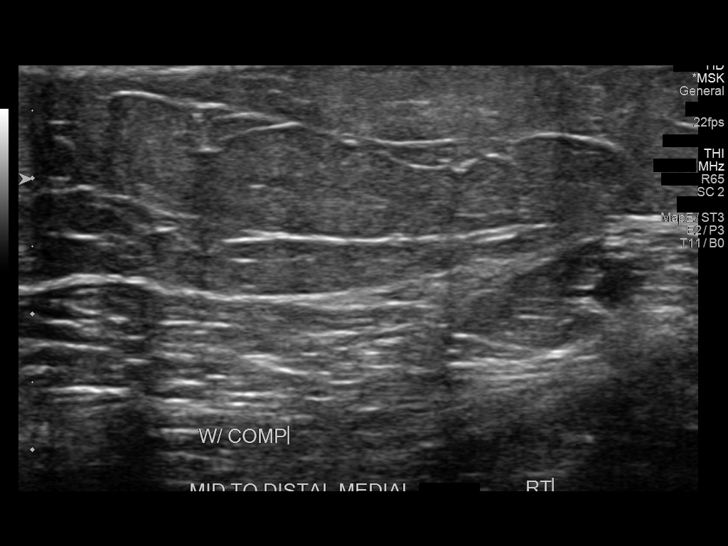
[im 18/20]
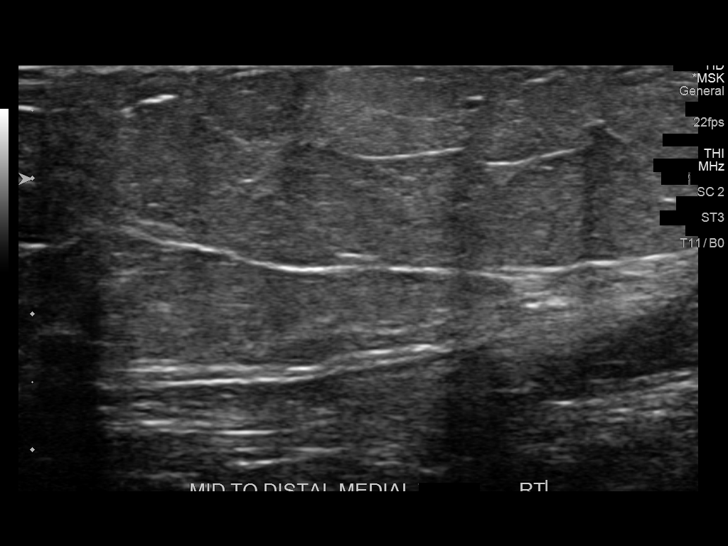
[im 20/20]
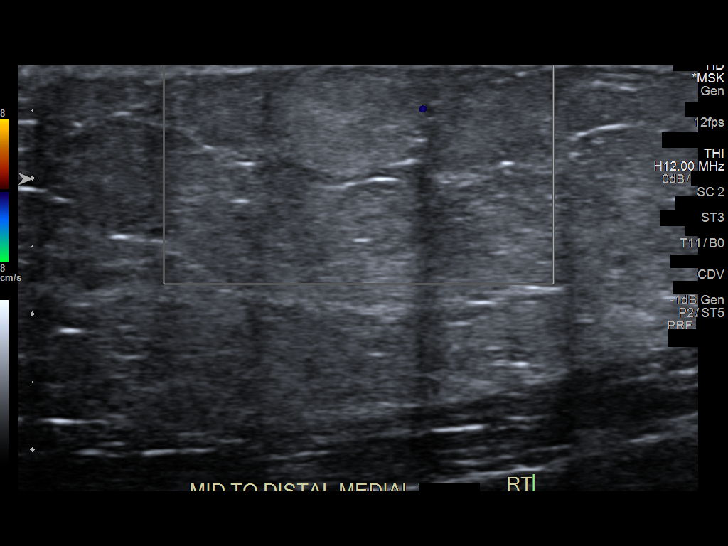

[14 of 20 positions shown; findings below may reference images not displayed]

FINDINGS: There is a 10 x 10 x 13 mm ill-defined area of increased signal
intensity in the otherwise normal appearing subcutaneous fat in the
area of concern almost identical to the appearance in the opposite
thigh. There is no abnormal perfusion. No cyst or worrisome mass
lesion. No adenopathy.
IMPRESSION: Small benign-appearing lipoma in the subcutaneous fat of the medial
aspect of the left thigh.

## 2020-05-16 IMAGING — MG DIGITAL SCREENING BILATERAL MAMMOGRAM WITH TOMO AND CAD
8 series · 8 of 24 positions shown · non-contrast
Comparison: Previous exam(s).

CLINICAL DATA: Screening.

EXAM:
DIGITAL SCREENING BILATERAL MAMMOGRAM WITH TOMO AND CAD

[L CC synth-2D]
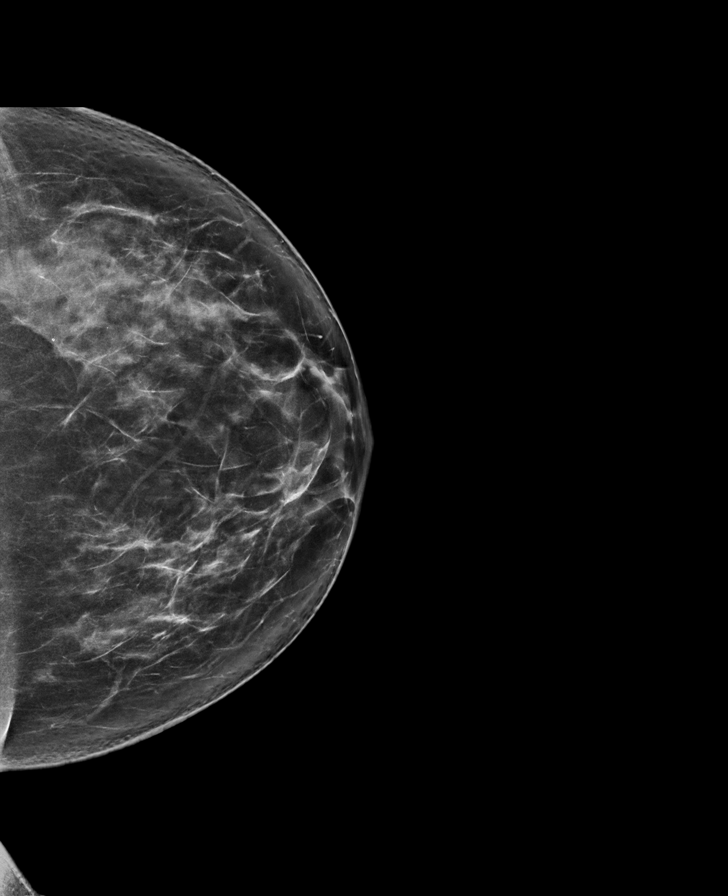

[R CC synth-2D]
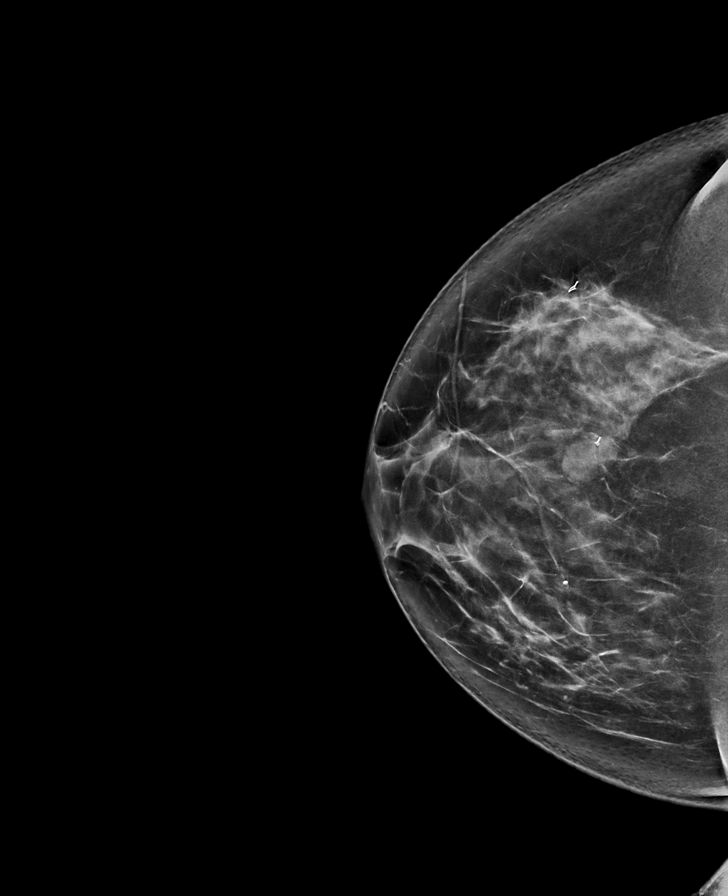

[L MLO synth-2D]
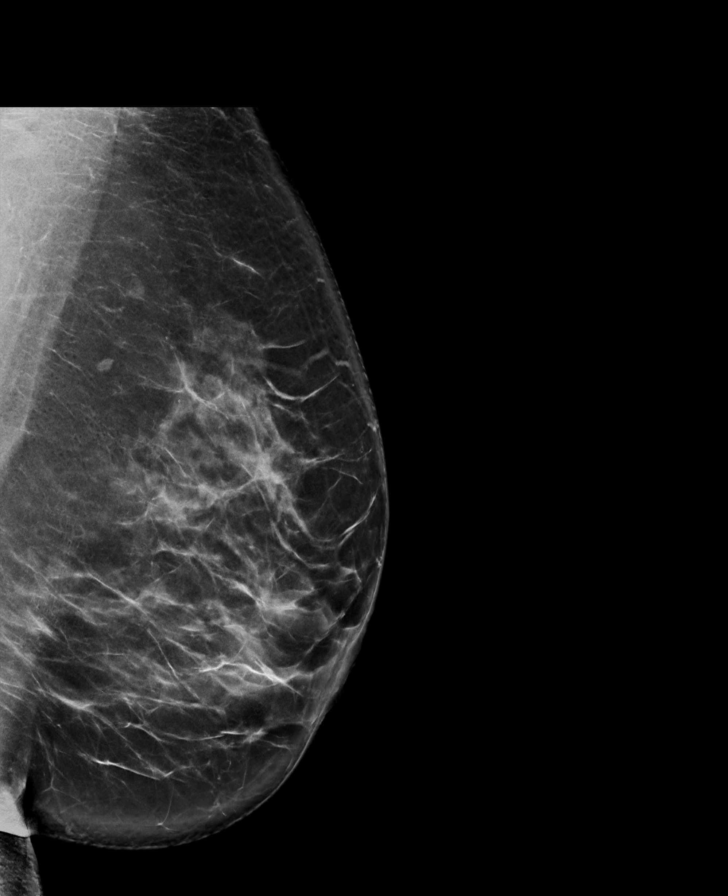

[R MLO synth-2D]
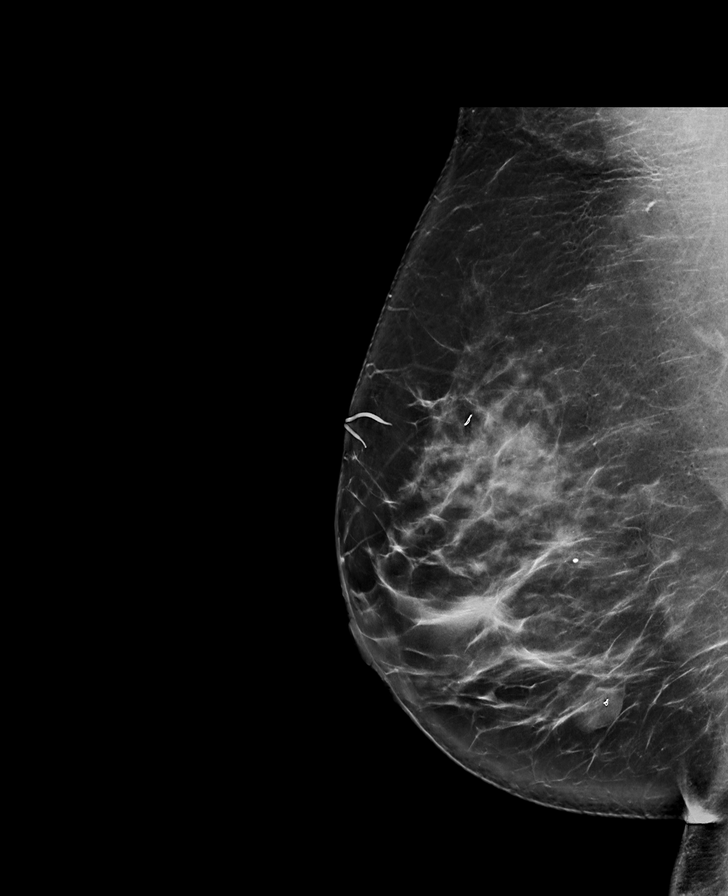

[R CC tomo · tomo slice 45/88.0]
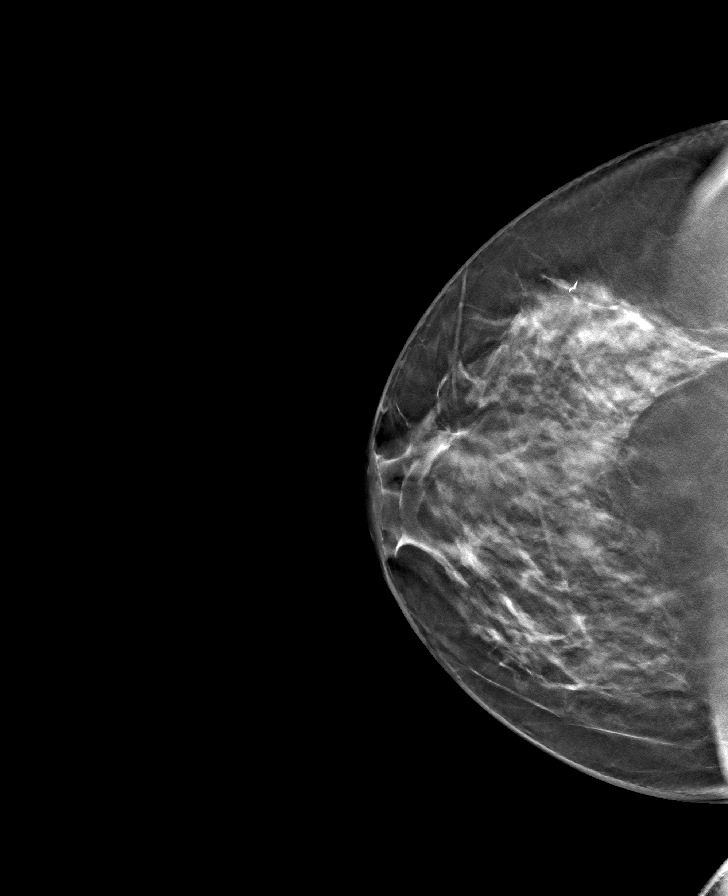

[L CC tomo · tomo slice 45/89.0]
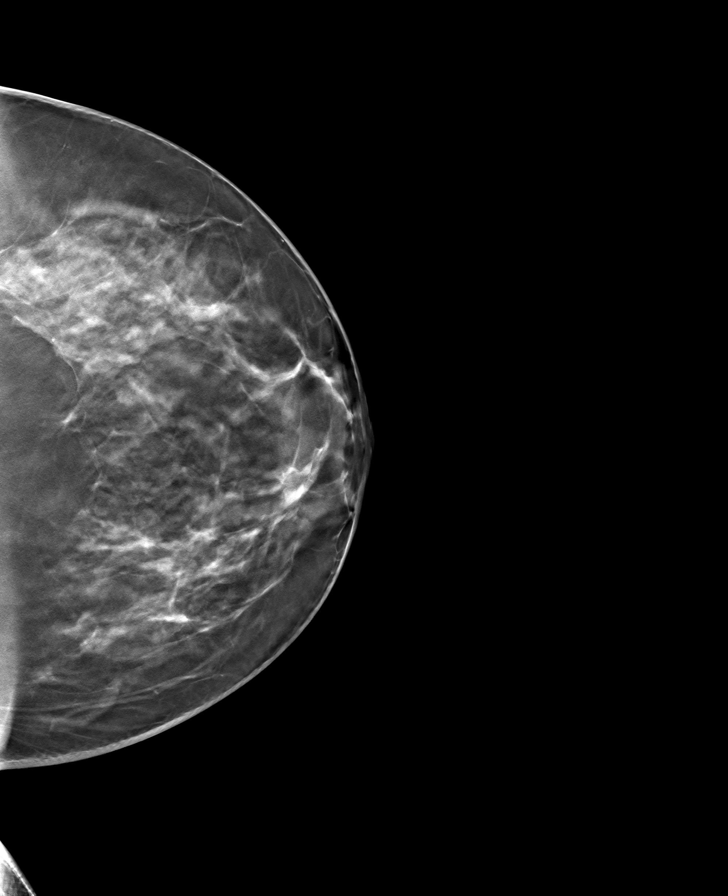

[R MLO tomo · tomo slice 48/95.0]
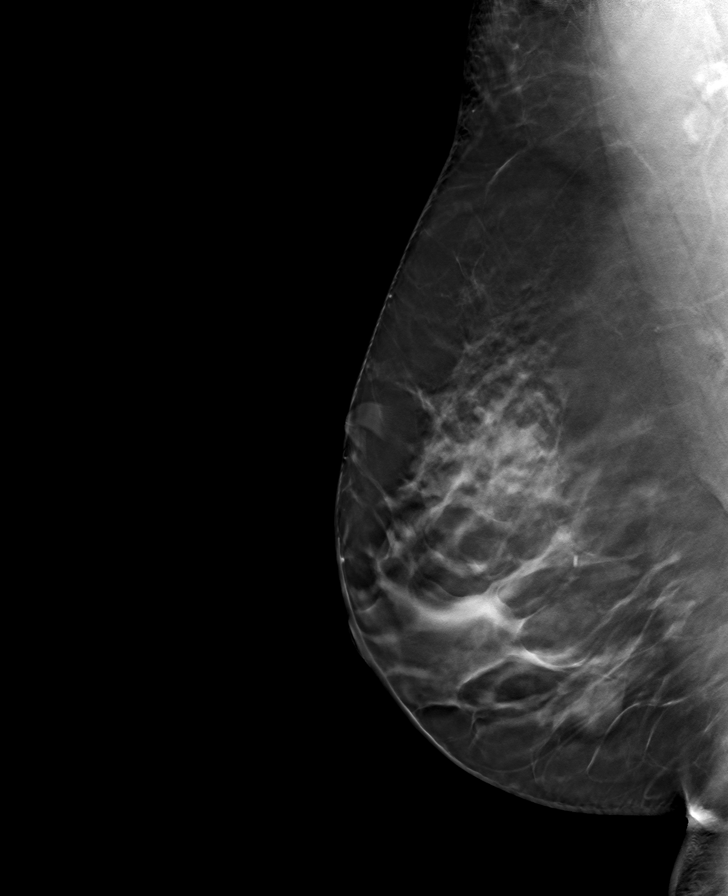

[L MLO tomo · tomo slice 51/101.0]
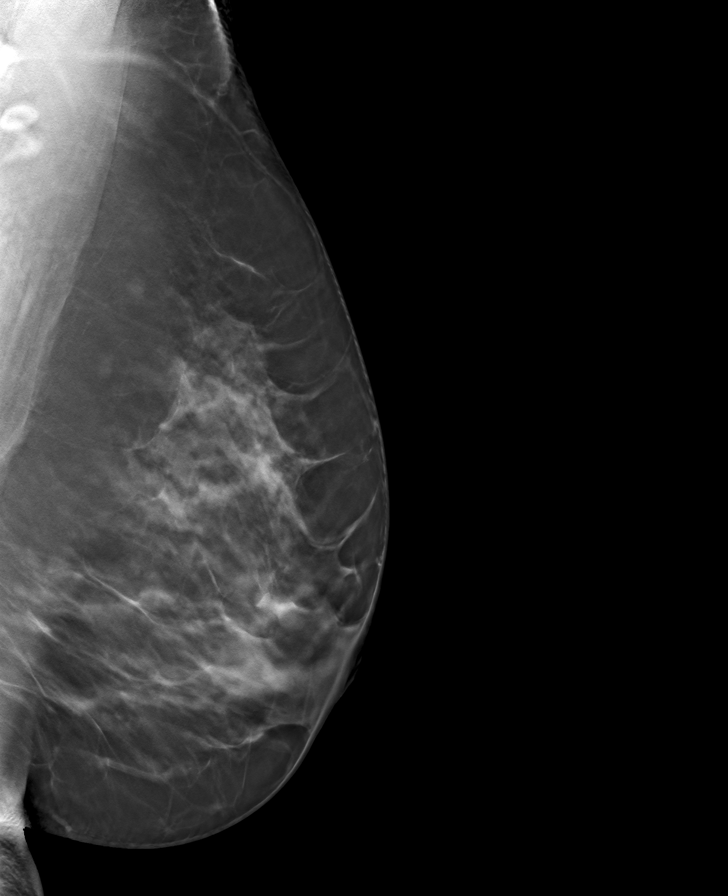

[8 of 24 positions shown; findings below may reference images not displayed]

ACR Breast Density Category c: The breast tissue is heterogeneously
dense, which may obscure small masses.
FINDINGS: There are no findings suspicious for malignancy. Images were
processed with CAD.
IMPRESSION: No mammographic evidence of malignancy. A result letter of this
screening mammogram will be mailed directly to the patient.

RECOMMENDATION:
Screening mammogram in one year. (Code:[id number]-[id number])

BI-RADS CATEGORY  1: Negative.
